# Patient Record
Sex: Male | Born: 1940 | Race: Black or African American | Hispanic: No | Marital: Married | Smoking: Never smoker
Health system: Southern US, Community
[De-identification: ages and names within clinical notes are randomized; demographics above are authoritative.]

## PROBLEM LIST (undated history)

## (undated) DIAGNOSIS — I519 Heart disease, unspecified: Secondary | ICD-10-CM

## (undated) DIAGNOSIS — M1990 Unspecified osteoarthritis, unspecified site: Secondary | ICD-10-CM

## (undated) DIAGNOSIS — I429 Cardiomyopathy, unspecified: Secondary | ICD-10-CM

## (undated) DIAGNOSIS — I10 Essential (primary) hypertension: Secondary | ICD-10-CM

## (undated) DIAGNOSIS — I219 Acute myocardial infarction, unspecified: Secondary | ICD-10-CM

## (undated) DIAGNOSIS — E785 Hyperlipidemia, unspecified: Secondary | ICD-10-CM

## (undated) DIAGNOSIS — K4090 Unilateral inguinal hernia, without obstruction or gangrene, not specified as recurrent: Secondary | ICD-10-CM

## (undated) HISTORY — DX: Heart disease, unspecified: I51.9

## (undated) HISTORY — DX: Unilateral inguinal hernia, without obstruction or gangrene, not specified as recurrent: K40.90

## (undated) HISTORY — DX: Unspecified osteoarthritis, unspecified site: M19.90

## (undated) HISTORY — PX: HERNIA REPAIR: SHX51

## (undated) HISTORY — PX: CORONARY ANGIOPLASTY WITH STENT PLACEMENT: SHX49

---

## 2005-11-04 ENCOUNTER — Other Ambulatory Visit: Payer: Self-pay

## 2005-11-04 ENCOUNTER — Emergency Department: Payer: Self-pay | Admitting: Emergency Medicine

## 2006-06-15 ENCOUNTER — Ambulatory Visit: Payer: Self-pay | Admitting: Gastroenterology

## 2006-11-16 HISTORY — PX: LIPOMA EXCISION: SHX5283

## 2007-02-09 ENCOUNTER — Ambulatory Visit: Payer: Self-pay | Admitting: *Deleted

## 2011-04-07 ENCOUNTER — Emergency Department: Payer: Self-pay | Admitting: Internal Medicine

## 2014-01-29 ENCOUNTER — Ambulatory Visit (INDEPENDENT_AMBULATORY_CARE_PROVIDER_SITE_OTHER): Payer: Medicare Other | Admitting: General Surgery

## 2014-01-29 ENCOUNTER — Encounter: Payer: Self-pay | Admitting: General Surgery

## 2014-01-29 VITALS — BP 110/70 | HR 74 | Resp 12 | Ht 71.0 in | Wt 146.0 lb

## 2014-01-29 DIAGNOSIS — K4090 Unilateral inguinal hernia, without obstruction or gangrene, not specified as recurrent: Secondary | ICD-10-CM

## 2014-01-29 DIAGNOSIS — K4020 Bilateral inguinal hernia, without obstruction or gangrene, not specified as recurrent: Secondary | ICD-10-CM

## 2014-01-29 DIAGNOSIS — K4021 Bilateral inguinal hernia, without obstruction or gangrene, recurrent: Secondary | ICD-10-CM

## 2014-01-29 NOTE — Patient Instructions (Addendum)
Patient to be schedule for bilateral inguinal hernia repairs. Patient to stop Plavix prior to   Hernia Repair with Laparoscope A hernia occurs when an internal organ pushes out through a weak spot in the belly (abdominal) wall muscles. Hernias most commonly occur in the groin and around the navel. Hernias can also occur through a cut by the surgeon (incision) after an abdominal operation. A hernia may be caused by:  Lifting heavy objects.  Prolonged coughing.  Straining to move your bowels. Hernias can often be pushed back into place (reduced). Most hernias tend to get worse over time. Problems occur when abdominal contents get stuck in the opening and the blood supply is blocked or impaired (incarcerated hernia). Because of these risks, you require surgery to repair the hernia. Your hernia will be repaired using a laparoscope. Laparoscopic surgery is a type of minimally invasive surgery. It does not involve making a typical surgical cut (incision) in the skin. A laparoscope is a telescope-like rod and lens system. It is usually connected to a video camera and a light source so your caregiver can clearly see the operative area. The instruments are inserted through  to  inch (5 mm or 10 mm) openings in the skin at specific locations. A working and viewing space is created by blowing a small amount of carbon dioxide gas into the abdominal cavity. The abdomen is essentially blown up like a balloon (insufflated). This elevates the abdominal wall above the internal organs like a dome. The carbon dioxide gas is common to the human body and can be absorbed by tissue and removed by the respiratory system. Once the repair is completed, the small incisions will be closed with either stitches (sutures) or staples (just like a paper stapler only this staple holds the skin together). LET YOUR CAREGIVERS KNOW ABOUT:  Allergies.  Medications taken including herbs, eye drops, over the counter medications, and  creams.  Use of steroids (by mouth or creams).  Previous problems with anesthetics or Novocaine.  Possibility of pregnancy, if this applies.  History of blood clots (thrombophlebitis).  History of bleeding or blood problems.  Previous surgery.  Other health problems. BEFORE THE PROCEDURE  Laparoscopy can be done either in a hospital or out-patient clinic. You may be given a mild sedative to help you relax before the procedure. Once in the operating room, you will be given a general anesthesia to make you sleep (unless you and your caregiver choose a different anesthetic).  AFTER THE PROCEDURE  After the procedure you will be watched in a recovery area. Depending on what type of hernia was repaired, you might be admitted to the hospital or you might go home the same day. With this procedure you may have less pain and scarring. This usually results in a quicker recovery and less risk of infection. HOME CARE INSTRUCTIONS   Bed rest is not required. You may continue your normal activities but avoid heavy lifting (more than 10 pounds) or straining.  Cough gently. If you are a smoker it is best to stop, as even the best hernia repair can break down with the continual strain of coughing.  Avoid driving until given the OK by your surgeon.  There are no dietary restrictions unless given otherwise.  TAKE ALL MEDICATIONS AS DIRECTED.  Only take over-the-counter or prescription medicines for pain, discomfort, or fever as directed by your caregiver. SEEK MEDICAL CARE IF:   There is increasing abdominal pain or pain in your incisions.  There is  more bleeding from incisions, other than minimal spotting.  You feel light headed or faint.  You develop an unexplained fever, chills, and/or an oral temperature above 102 F (38.9 C).  You have redness, swelling, or increasing pain in the wound.  Pus coming from wound.  A foul smell coming from the wound or dressings. SEEK IMMEDIATE MEDICAL  CARE IF:   You develop a rash.  You have difficulty breathing.  You have any allergic problems. MAKE SURE YOU:   Understand these instructions.  Will watch your condition.  Will get help right away if you are not doing well or get worse. Document Released: 11/02/2005 Document Revised: 01/25/2012 Document Reviewed: 10/02/2009 [facility] Patient Information 2014 [place], [place].  Patient's surgery has been scheduled for 02-15-14 at [facility]. This patient will see Dr. Saralyn Pilar at [facility] on 02-08-14 at 2 pm for cardiac clearance. He is aware of all instructions.

## 2014-01-29 NOTE — Progress Notes (Signed)
Patient ID: ADAR RASE, male   DOB: 10-20-1941, 73 y.o.   MRN: [id number]  Chief Complaint  Patient presents with  . Follow-up    Evaluation of recurrent inguinal hernia. Patient last seen in 2008    HPI Samuel Mcgee is a 73 y.o. male who presents for an evaluation of a recurrent left inguinal hernia. The patient states he had this hernia repaired approximately 12 years ago but is back now. He noticed the hernia again approximately 2 years ago. It has gotten larger and is tender. He is able to push the hernia back in.   HPI  Past Medical History  Diagnosis Date  . Heart disease   . Inguinal hernia   . Arthritis     Past Surgical History  Procedure Laterality Date  . Hernia repair Left 2003?  Marland Kitchen Lipoma excision Right 2008    right posterior neck    History reviewed. No pertinent family history.  Social History History  Substance Use Topics  . Smoking status: Never Smoker   . Smokeless tobacco: Never Used  . Alcohol Use: No    No Known Allergies  Current Outpatient Prescriptions  Medication Sig Dispense Refill  . aspirin 81 MG tablet Take 81 mg by mouth daily.      Marland Kitchen atorvastatin (LIPITOR) 80 MG tablet Take 80 mg by mouth daily.      . carvedilol (COREG) 6.25 MG tablet Take 6.25 mg by mouth 2 (two) times daily with a meal.      . clopidogrel (PLAVIX) 75 MG tablet Take 75 mg by mouth daily with breakfast.      . lisinopril (PRINIVIL,ZESTRIL) 10 MG tablet Take 10 mg by mouth daily.       No current facility-administered medications for this visit.    Review of Systems Review of Systems  Constitutional: Negative.   Respiratory: Negative.   Cardiovascular: Negative.   Gastrointestinal: Positive for abdominal pain.    Blood pressure 110/70, pulse 74, resp. rate 12, height 5\' 11"  (1.803 m), weight 146 lb (66.225 kg).  Physical Exam Physical Exam  Constitutional: He is oriented to person, place, and time. He appears well-developed and well-nourished.   Eyes: Conjunctivae are normal. No scleral icterus.  Neck: Neck supple. No thyromegaly present.  Cardiovascular: Normal rate, regular rhythm and normal heart sounds.   No murmur heard. Pulmonary/Chest: Effort normal and breath sounds normal.  Abdominal: Soft. Normal appearance and bowel sounds are normal. There is no hepatosplenomegaly. There is no tenderness. A hernia is present. Hernia confirmed positive in the right inguinal area (small hernia present) and confirmed positive in the left inguinal area (medium sized hernia present).  Lymphadenopathy:    He has no cervical adenopathy.  Neurological: He is alert and oriented to person, place, and time.  Skin: Skin is warm and dry.    Data Reviewed none  Assessment    Bilateral ing hernia, left recurrent     Plan    Discussed repair. Feel laparoscopy and bil hernia repair is reasonable. Risks/benfits dicussed. Pt is agreeable.     Patient's surgery has been scheduled for 02-15-14 at [facility]. This patient will see his cardiologist, Dr. Saralyn Pilar at [facility], on 02-08-14 at 2 pm for cardiac clearance. Patient will need to stop Plavix for 5 days prior to surgery if Dr. Saralyn Pilar is agreeable. He is aware of all instructions.    SANKAR,SEEPLAPUTHUR G 01/30/2014, 6:22 AM

## 2014-01-30 ENCOUNTER — Encounter: Payer: Self-pay | Admitting: General Surgery

## 2014-01-30 DIAGNOSIS — K4021 Bilateral inguinal hernia, without obstruction or gangrene, recurrent: Secondary | ICD-10-CM | POA: Insufficient documentation

## 2014-01-30 LAB — CBC WITH DIFFERENTIAL/PLATELET
BASOS ABS: 0 10*3/uL (ref 0.0–0.2)
BASOS: 1 %
Eos: 2 %
Eosinophils Absolute: 0.1 10*3/uL (ref 0.0–0.4)
HCT: 40 % (ref 37.5–51.0)
HEMOGLOBIN: 13.6 g/dL (ref 12.6–17.7)
Immature Grans (Abs): 0 10*3/uL (ref 0.0–0.1)
Immature Granulocytes: 0 %
LYMPHS ABS: 1.9 10*3/uL (ref 0.7–3.1)
LYMPHS: 41 %
MCH: 28.5 pg (ref 26.6–33.0)
MCHC: 34 g/dL (ref 31.5–35.7)
MCV: 84 fL (ref 79–97)
MONOS ABS: 0.5 10*3/uL (ref 0.1–0.9)
Monocytes: 11 %
NEUTROS ABS: 2.1 10*3/uL (ref 1.4–7.0)
Neutrophils Relative %: 45 %
RBC: 4.78 x10E6/uL (ref 4.14–5.80)
RDW: 14.6 % (ref 12.3–15.4)
WBC: 4.6 10*3/uL (ref 3.4–10.8)

## 2014-01-30 LAB — COMPREHENSIVE METABOLIC PANEL
ALK PHOS: 78 IU/L (ref 39–117)
ALT: 14 IU/L (ref 0–44)
AST: 16 IU/L (ref 0–40)
Albumin/Globulin Ratio: 1.5 (ref 1.1–2.5)
Albumin: 4.3 g/dL (ref 3.5–4.8)
BILIRUBIN TOTAL: 0.5 mg/dL (ref 0.0–1.2)
BUN / CREAT RATIO: 25 — AB (ref 10–22)
BUN: 32 mg/dL — ABNORMAL HIGH (ref 8–27)
CHLORIDE: 104 mmol/L (ref 97–108)
CO2: 23 mmol/L (ref 18–29)
Calcium: 9.7 mg/dL (ref 8.6–10.2)
Creatinine, Ser: 1.29 mg/dL — ABNORMAL HIGH (ref 0.76–1.27)
GFR, EST AFRICAN AMERICAN: 63 mL/min/{1.73_m2} (ref >59)
GFR, EST NON AFRICAN AMERICAN: 54 mL/min/{1.73_m2} — AB (ref >59)
Globulin, Total: 2.9 g/dL (ref 1.5–4.5)
Glucose: 114 mg/dL — ABNORMAL HIGH (ref 65–99)
POTASSIUM: 4.8 mmol/L (ref 3.5–5.2)
SODIUM: 142 mmol/L (ref 134–144)
Total Protein: 7.2 g/dL (ref 6.0–8.5)

## 2014-02-06 ENCOUNTER — Other Ambulatory Visit: Payer: Self-pay | Admitting: General Surgery

## 2014-02-06 DIAGNOSIS — K4021 Bilateral inguinal hernia, without obstruction or gangrene, recurrent: Secondary | ICD-10-CM

## 2014-02-08 ENCOUNTER — Telehealth: Payer: Self-pay | Admitting: *Deleted

## 2014-02-08 ENCOUNTER — Ambulatory Visit: Payer: Self-pay | Admitting: General Surgery

## 2014-02-08 DIAGNOSIS — I10 Essential (primary) hypertension: Secondary | ICD-10-CM

## 2014-02-08 LAB — CBC WITH DIFFERENTIAL/PLATELET
Basophil #: 0 10*3/uL (ref 0.0–0.1)
Basophil %: 0.6 %
EOS ABS: 0.1 10*3/uL (ref 0.0–0.7)
EOS PCT: 1.5 %
HCT: 37.9 % — ABNORMAL LOW (ref 40.0–52.0)
HGB: 12.1 g/dL — ABNORMAL LOW (ref 13.0–18.0)
LYMPHS ABS: 1.4 10*3/uL (ref 1.0–3.6)
Lymphocyte %: 26.9 %
MCH: 28.3 pg (ref 26.0–34.0)
MCHC: 32 g/dL (ref 32.0–36.0)
MCV: 89 fL (ref 80–100)
Monocyte #: 0.7 x10 3/mm (ref 0.2–1.0)
Monocyte %: 14.7 %
Neutrophil #: 2.8 10*3/uL (ref 1.4–6.5)
Neutrophil %: 56.3 %
Platelet: 140 10*3/uL — ABNORMAL LOW (ref 150–440)
RBC: 4.28 10*6/uL — AB (ref 4.40–5.90)
RDW: 14.2 % (ref 11.5–14.5)
WBC: 5 10*3/uL (ref 3.8–10.6)

## 2014-02-08 LAB — BASIC METABOLIC PANEL
Anion Gap: 3 — ABNORMAL LOW (ref 7–16)
BUN: 24 mg/dL — AB (ref 7–18)
CHLORIDE: 107 mmol/L (ref 98–107)
CO2: 30 mmol/L (ref 21–32)
Calcium, Total: 8.7 mg/dL (ref 8.5–10.1)
Creatinine: 1.34 mg/dL — ABNORMAL HIGH (ref 0.60–1.30)
GFR CALC NON AF AMER: 52 — AB
Glucose: 126 mg/dL — ABNORMAL HIGH (ref 65–99)
Osmolality: 285 (ref 275–301)
Potassium: 4.1 mmol/L (ref 3.5–5.1)
SODIUM: 140 mmol/L (ref 136–145)

## 2014-02-08 NOTE — Telephone Encounter (Signed)
Per Threasa Beards, pre-admission testing called today to report that patient missed his pre-admission appointment.   Wife was called and questioned about this. She states patient is at the hospital now. I called pre-admission and they state he is not there. Wife was asked to have patient call the office back once he returns home to clarify.

## 2014-02-08 NOTE — Telephone Encounter (Signed)
Patient called back to confirm that he did get appointment times with Dr. Saralyn Pilar and Pre-admit Testing mixed up. He states that he did go for both appointments though. Date for surgery was confirmed with the patient. This patient was instructed to call should he have further questions.  Santiago Glad from [employer] confirmed that patient was seen this afternoon for pre-admission testing appointment.

## 2014-02-09 ENCOUNTER — Encounter: Payer: Self-pay | Admitting: General Surgery

## 2014-02-15 ENCOUNTER — Telehealth: Payer: Self-pay | Admitting: General Surgery

## 2014-02-15 ENCOUNTER — Telehealth: Payer: Self-pay | Admitting: *Deleted

## 2014-02-15 NOTE — Telephone Encounter (Signed)
Per Chong Sicilian (nurse at [facility]), patient was seen today by Dr. Tomasita Morrow and placed on Augmentin x 10 days. The nurse is aware we need to have medical clearance faxed to the office before we can get patient's hernia surgery rescheduled.

## 2014-02-15 NOTE — Telephone Encounter (Signed)
PATIENT CALLED TODAY STATING HE WAS UNABLE TO HAVE IS SURGERY BY DR Jamal Collin TODAY(02-15-14). HE HAD A FEVER & WENT TO HIS PCP(DR EMMA WILLIAMS). HE WAS ASK TO CALL TO THEIR OFC & HAVE THEM FAX A NOTE STATING HE WAS CLEARED TOHAVE SURGERY.AT [place] CAN BE RESCHEDULE .

## 2014-03-06 ENCOUNTER — Telehealth: Payer: Self-pay | Admitting: *Deleted

## 2014-03-06 NOTE — Telephone Encounter (Signed)
Pt called and wants to reschedule his hernia surgery with Dr.Sankar, there was a note in his chart on 02/15/14 stating that they are waiting to get his medical clearance back before we can reschedule.

## 2014-03-07 NOTE — Telephone Encounter (Signed)
Spoke with patient's wife and let her know that the patient can call back to reschedule his hernia surgery. I also let her know that he will need a pre op appointment with Dr Jamal Collin before he has his hernia surgery.

## 2014-03-21 ENCOUNTER — Encounter: Payer: Self-pay | Admitting: General Surgery

## 2014-03-21 ENCOUNTER — Ambulatory Visit (INDEPENDENT_AMBULATORY_CARE_PROVIDER_SITE_OTHER): Payer: Medicare Other | Admitting: General Surgery

## 2014-03-21 VITALS — BP 122/74 | HR 66 | Temp 97.3°F | Resp 14 | Ht 71.0 in | Wt 139.0 lb

## 2014-03-21 DIAGNOSIS — K4021 Bilateral inguinal hernia, without obstruction or gangrene, recurrent: Secondary | ICD-10-CM

## 2014-03-21 NOTE — Progress Notes (Signed)
Patient ID: Samuel Mcgee, male   DOB: Jan 29, 1941, 73 y.o.   MRN: [id number]  Chief Complaint  Patient presents with  . Pre-op Exam    bilateral hernia    HPI Samuel Mcgee is a 73 y.o. male.  Here today for pre operative visit for bilateral inguinal hernia. States he has been doing well. He had previously been scheduled for surgery 02-15-14 but presented with fever on the day of surgery. He has since then recovered from sinusitis and cleared for surgery.  HPI  Past Medical History  Diagnosis Date  . Heart disease   . Inguinal hernia   . Arthritis     Past Surgical History  Procedure Laterality Date  . Hernia repair Left 2003?  Marland Kitchen Lipoma excision Right 2008    right posterior neck    History reviewed. No pertinent family history.  Social History History  Substance Use Topics  . Smoking status: Never Smoker   . Smokeless tobacco: Never Used  . Alcohol Use: No    No Known Allergies  Current Outpatient Prescriptions  Medication Sig Dispense Refill  . aspirin 81 MG tablet Take 81 mg by mouth daily.      Marland Kitchen atorvastatin (LIPITOR) 80 MG tablet Take 80 mg by mouth daily.      . carvedilol (COREG) 6.25 MG tablet Take 6.25 mg by mouth 2 (two) times daily with a meal.      . clopidogrel (PLAVIX) 75 MG tablet Take 75 mg by mouth daily with breakfast.      . lisinopril (PRINIVIL,ZESTRIL) 10 MG tablet Take 10 mg by mouth daily.       No current facility-administered medications for this visit.    Review of Systems Review of Systems  Constitutional: Negative.   Respiratory: Negative.   Cardiovascular: Negative.     Blood pressure 122/74, pulse 66, temperature 97.3 F (36.3 C), temperature source Oral, resp. rate 14, height 5\' 11"  (1.803 m), weight 139 lb (63.05 kg).  Physical Exam Physical Exam  Constitutional: He is oriented to person, place, and time. He appears well-developed and well-nourished.  Eyes: Conjunctivae are normal.  Neck: Neck supple.   Cardiovascular: Normal rate, regular rhythm and normal heart sounds.   Pulmonary/Chest: Effort normal and breath sounds normal.  Abdominal: Soft. Normal appearance. There is no tenderness. A hernia is present. Hernia confirmed positive in the right inguinal area and confirmed positive in the left inguinal area.  Lymphadenopathy:    He has no cervical adenopathy.  Neurological: He is alert and oriented to person, place, and time.  Skin: Skin is warm and dry.    Data Reviewed Clearance from Dr Jimmye Norman PCP  Assessment    Bilateral inguinal hernia present.    Plan    Reschedule surgery.    Patient's surgery has been rescheduled at [facility] for 04/03/14. He will pre admit by phone on 03/23/14. He will hold his Plavix for 5 days prior and he will hold his Aspirin for 3 days prior to surgery. Patient is aware of date, and all instructions.    Samuel Mcgee 03/21/2014, 6:46 PM

## 2014-03-21 NOTE — Patient Instructions (Addendum)
Patient's surgery has been rescheduled at [facility] for 04/03/14. He will pre admit by phone on 03/23/14. He will hold his Plavix for 5 days prior and he will hold his Aspirin for 3 days prior to surgery. Patient is aware of date, and all instructions.

## 2014-04-03 ENCOUNTER — Ambulatory Visit: Payer: Self-pay | Admitting: General Surgery

## 2014-04-03 DIAGNOSIS — K4020 Bilateral inguinal hernia, without obstruction or gangrene, not specified as recurrent: Secondary | ICD-10-CM

## 2014-04-03 HISTORY — PX: INGUINAL HERNIA REPAIR: SHX194

## 2014-04-03 HISTORY — PX: HERNIA REPAIR: SHX51

## 2014-04-04 ENCOUNTER — Encounter: Payer: Self-pay | Admitting: General Surgery

## 2014-04-12 ENCOUNTER — Encounter: Payer: Self-pay | Admitting: General Surgery

## 2014-04-12 ENCOUNTER — Ambulatory Visit (INDEPENDENT_AMBULATORY_CARE_PROVIDER_SITE_OTHER): Payer: Medicare Other | Admitting: General Surgery

## 2014-04-12 VITALS — BP 104/66 | HR 60 | Resp 12 | Ht 71.0 in | Wt 136.0 lb

## 2014-04-12 DIAGNOSIS — K4021 Bilateral inguinal hernia, without obstruction or gangrene, recurrent: Secondary | ICD-10-CM

## 2014-04-12 NOTE — Progress Notes (Signed)
Here today for postoperative bilateral inguinal hernia done 04-03-14. States he is doing well. Minimal pain.  Abdomin soft. Port sites healing well.  Patient does have a 4-5 cm seroma in the left groin. Likely from the large hernia he had there. No hernias identified at this time.   Seroma was aspirated  And removed  35 ml. Blood stained seroma fluid. Patient to return in 2 weeks.

## 2014-04-12 NOTE — Patient Instructions (Addendum)
The patient is aware to call back for any questions or concerns. Patient advised to use a heating pad daily. Patient to return in 2-3 weeks.

## 2014-05-02 ENCOUNTER — Ambulatory Visit: Payer: Medicare Other | Admitting: General Surgery

## 2014-05-02 ENCOUNTER — Encounter: Payer: Self-pay | Admitting: General Surgery

## 2014-05-02 VITALS — BP 112/70 | HR 60 | Resp 12 | Ht 71.0 in | Wt 133.0 lb

## 2014-05-02 DIAGNOSIS — T148XXA Other injury of unspecified body region, initial encounter: Secondary | ICD-10-CM

## 2014-05-02 DIAGNOSIS — K4021 Bilateral inguinal hernia, without obstruction or gangrene, recurrent: Secondary | ICD-10-CM

## 2014-05-02 NOTE — Patient Instructions (Signed)
The patient is aware to call back for any questions or concerns.  

## 2014-05-02 NOTE — Progress Notes (Signed)
Here today for postoperative visit. He had bilateral inguinal hernia repair 04-03-14. States he is doing well. Not using any pain mediation. Slight knot still on the left groin area.  Seroma left groin, old hematoma too thick to drain with syringe. Hernia repairs are intact. Will recheck in 2 weeks.Advised to use heat to the area of swelling.

## 2014-05-16 ENCOUNTER — Ambulatory Visit: Payer: Medicare Other | Admitting: General Surgery

## 2014-05-22 ENCOUNTER — Encounter: Payer: Self-pay | Admitting: General Surgery

## 2014-05-22 ENCOUNTER — Ambulatory Visit (INDEPENDENT_AMBULATORY_CARE_PROVIDER_SITE_OTHER): Payer: Self-pay | Admitting: General Surgery

## 2014-05-22 VITALS — BP 108/62 | HR 70 | Resp 12 | Ht 71.0 in | Wt 135.0 lb

## 2014-05-22 DIAGNOSIS — K4021 Bilateral inguinal hernia, without obstruction or gangrene, recurrent: Secondary | ICD-10-CM

## 2014-05-22 NOTE — Progress Notes (Signed)
Here today for postoperative visit. He had bilateral inguinal hernia repair 04-03-14. States he is doing well. Not using any pain mediation Hernia repairs are intact. Hematoma is much smaller in left groin.  Patient to return in one month

## 2014-05-22 NOTE — Patient Instructions (Addendum)
Patient to return in one month. 

## 2014-05-23 ENCOUNTER — Encounter: Payer: Self-pay | Admitting: General Surgery

## 2014-06-21 ENCOUNTER — Ambulatory Visit (INDEPENDENT_AMBULATORY_CARE_PROVIDER_SITE_OTHER): Payer: Self-pay | Admitting: General Surgery

## 2014-06-21 ENCOUNTER — Encounter: Payer: Self-pay | Admitting: General Surgery

## 2014-06-21 VITALS — BP 110/70 | HR 66 | Resp 12 | Ht 71.0 in | Wt 134.0 lb

## 2014-06-21 DIAGNOSIS — K4021 Bilateral inguinal hernia, without obstruction or gangrene, recurrent: Secondary | ICD-10-CM

## 2014-06-21 NOTE — Progress Notes (Signed)
Here today for postoperative visit, bilateral inguinal hernia repair done 04-03-14. States he is doing well.  Hernia repairs are intact. Hematoma is much smaller, markedly reduced in size (1.5cm) in left groin. Follow as needed.

## 2014-06-21 NOTE — Patient Instructions (Signed)
Follow up as needed

## 2015-03-09 NOTE — Op Note (Signed)
PATIENT NAME:  Samuel Mcgee, Samuel Mcgee MR#:  [id number] DATE OF BIRTH:  11-21-1940  DATE OF PROCEDURE:  04/03/2014  PREOPERATIVE DIAGNOSIS: Bilateral inguinal hernias.   POSTOPERATIVE DIAGNOSIS: Bilateral inguinal hernias, direct variety.   OPERATION PERFORMED: Laparoscopy, repair of bilateral inguinal hernias.   SURGEON: S.G. Jamal Collin, MD  ANESTHESIA: General.   COMPLICATIONS: None.   ESTIMATED BLOOD LOSS: Minimal.   DRAINS: None.   DESCRIPTION OF PROCEDURE: The patient was put to sleep in the supine position on the operating table. A Foley catheter was inserted and removed at the end of the procedure. The abdomen was prepped and draped out as a sterile field. Timeout was performed. A small incision was made just above the umbilicus, and the Veress needle with the InnerDyne sleeve was positioned in the peritoneal cavity, verified with the hanging drop method. Pneumoperitoneum was obtained, followed by placement of a 10 mm port. The camera was then introduced, and 2 lateral 5 mm ports were placed, a 5 mm on the left and an 11 mm on the right. With the patient placed in slight Trendelenburg, the inguinal regions were exposed, showing the patient had fairly sizable defect in the medial portion of the inguinal canal just medial to the cord structures on the left side, and in the more medial location, located just above and lateral to the pubic symphysis on the right side. No indirect sac was noted. With careful exposure, the peritoneum was scored using a Thunderbeat device across the inguinal canal on the left side, and the peritoneum was reflected off from the posterior aspect of the inguinal canal. On the left side, there was a large fatty protrusion of preperitoneal fat going through the defect in the medial portion. This was freed completely, and with the Thunderbeat device, was taken down and then removed from the peritoneal cavity with a retrieval bag. The internal ring area was adequately exposed. The  posterior wall was well-delineated. In a similar fashion, the peritoneum was scored on the right side, and again the peritoneal reflection was taken down to expose the defect in the medial portion of the inguinal canal, with the posterior wall exposed all the way past the internal ring area. Following this, two Bard 3DMax mesh were positioned in the peritoneal cavity. This was the medium size, one for the left and one for the right. These were then positioned into the posterior wall area with the medial marked edge placed against the pubic tubercle. SecureStrap was used to tack it to the pubic tubercle and the inguinal ligament below and just lateral to the internal ring, and a couple placed along the superior aspect and medial aspect. This was done on both sides. A satisfactory reinforcement with the mesh was noted. The peritoneum was then reapproximated over the mesh using again the SecureStrap to tack it down. Following this, the pneumoperitoneum was released. The fascial opening in the umbilicus was closed with 0 Vicryl. Ports were removed, and the skin incision was closed with subcuticular 4-0 Vicryl, covered with Dermabond. The procedure was well tolerated, and he was subsequently extubated and returned to the recovery room in stable condition.   ____________________________ S.Robinette Haines, MD sgs:lb D: 04/04/2014 08:13:35 ET T: 04/04/2014 08:26:51 ET JOB#: [id number]  cc: S.G. Jamal Collin, MD, <Dictator> [facility] Robinette Haines MD ELECTRONICALLY SIGNED 04/05/2014 14:44

## 2016-12-14 ENCOUNTER — Encounter: Payer: Self-pay | Admitting: *Deleted

## 2016-12-15 ENCOUNTER — Ambulatory Visit
Admission: RE | Admit: 2016-12-15 | Discharge: 2016-12-15 | Disposition: A | Discharge status: Home / Self Care | Payer: Medicare HMO | Source: Ambulatory Visit | Attending: Gastroenterology | Admitting: Gastroenterology

## 2016-12-15 ENCOUNTER — Ambulatory Visit: Payer: Medicare HMO | Admitting: Anesthesiology

## 2016-12-15 ENCOUNTER — Encounter
Admission: RE | Disposition: A | Discharge status: Home / Self Care | Payer: Self-pay | Source: Ambulatory Visit | Attending: Gastroenterology

## 2016-12-15 DIAGNOSIS — D120 Benign neoplasm of cecum: Secondary | ICD-10-CM | POA: Insufficient documentation

## 2016-12-15 DIAGNOSIS — Z7902 Long term (current) use of antithrombotics/antiplatelets: Secondary | ICD-10-CM | POA: Insufficient documentation

## 2016-12-15 DIAGNOSIS — I429 Cardiomyopathy, unspecified: Secondary | ICD-10-CM | POA: Diagnosis not present

## 2016-12-15 DIAGNOSIS — K648 Other hemorrhoids: Secondary | ICD-10-CM | POA: Diagnosis not present

## 2016-12-15 DIAGNOSIS — Z7982 Long term (current) use of aspirin: Secondary | ICD-10-CM | POA: Diagnosis not present

## 2016-12-15 DIAGNOSIS — I252 Old myocardial infarction: Secondary | ICD-10-CM | POA: Diagnosis not present

## 2016-12-15 DIAGNOSIS — Z1211 Encounter for screening for malignant neoplasm of colon: Secondary | ICD-10-CM | POA: Insufficient documentation

## 2016-12-15 DIAGNOSIS — I2510 Atherosclerotic heart disease of native coronary artery without angina pectoris: Secondary | ICD-10-CM | POA: Diagnosis not present

## 2016-12-15 DIAGNOSIS — Z79899 Other long term (current) drug therapy: Secondary | ICD-10-CM | POA: Diagnosis not present

## 2016-12-15 DIAGNOSIS — N400 Enlarged prostate without lower urinary tract symptoms: Secondary | ICD-10-CM | POA: Diagnosis not present

## 2016-12-15 DIAGNOSIS — M1990 Unspecified osteoarthritis, unspecified site: Secondary | ICD-10-CM | POA: Insufficient documentation

## 2016-12-15 DIAGNOSIS — Z955 Presence of coronary angioplasty implant and graft: Secondary | ICD-10-CM | POA: Diagnosis not present

## 2016-12-15 DIAGNOSIS — E785 Hyperlipidemia, unspecified: Secondary | ICD-10-CM | POA: Diagnosis not present

## 2016-12-15 DIAGNOSIS — I10 Essential (primary) hypertension: Secondary | ICD-10-CM | POA: Diagnosis not present

## 2016-12-15 HISTORY — PX: COLONOSCOPY WITH PROPOFOL: SHX5780

## 2016-12-15 HISTORY — DX: Essential (primary) hypertension: I10

## 2016-12-15 HISTORY — DX: Acute myocardial infarction, unspecified: I21.9

## 2016-12-15 HISTORY — DX: Cardiomyopathy, unspecified: I42.9

## 2016-12-15 HISTORY — DX: Hyperlipidemia, unspecified: E78.5

## 2016-12-15 SURGERY — COLONOSCOPY WITH PROPOFOL
Anesthesia: General

## 2016-12-15 MED ORDER — PROPOFOL 500 MG/50ML IV EMUL
INTRAVENOUS | Status: DC | PRN
  Administered 2016-12-15: 25 ug/kg/min via INTRAVENOUS

## 2016-12-15 MED ORDER — FENTANYL CITRATE (PF) 100 MCG/2ML IJ SOLN
INTRAMUSCULAR | Status: AC
  Filled 2016-12-15: qty 2

## 2016-12-15 MED ORDER — EPHEDRINE 5 MG/ML INJ
INTRAVENOUS | Status: AC
  Filled 2016-12-15: qty 10

## 2016-12-15 MED ORDER — PROPOFOL 500 MG/50ML IV EMUL
INTRAVENOUS | Status: AC
  Filled 2016-12-15: qty 50

## 2016-12-15 MED ORDER — MIDAZOLAM HCL 2 MG/2ML IJ SOLN
INTRAMUSCULAR | Status: AC
  Filled 2016-12-15: qty 2

## 2016-12-15 MED ORDER — SODIUM CHLORIDE 0.9 % IV SOLN
INTRAVENOUS | Status: DC
  Administered 2016-12-15: 08:00:00 via INTRAVENOUS

## 2016-12-15 MED ORDER — SODIUM CHLORIDE 0.9 % IV SOLN: INTRAVENOUS | Status: DC

## 2016-12-15 MED ORDER — EPHEDRINE SULFATE 50 MG/ML IJ SOLN
INTRAMUSCULAR | Status: DC | PRN
  Administered 2016-12-15 (×5): 10 mg via INTRAVENOUS

## 2016-12-15 MED ORDER — FENTANYL CITRATE (PF) 100 MCG/2ML IJ SOLN
INTRAMUSCULAR | Status: DC | PRN
  Administered 2016-12-15: 25 ug via INTRAVENOUS

## 2016-12-15 MED ORDER — PROPOFOL 10 MG/ML IV BOLUS
INTRAVENOUS | Status: DC | PRN
  Administered 2016-12-15: 30 mg via INTRAVENOUS
  Administered 2016-12-15: 20 mg via INTRAVENOUS

## 2016-12-15 MED ORDER — LIDOCAINE HCL (PF) 2 % IJ SOLN
INTRAMUSCULAR | Status: DC | PRN
  Administered 2016-12-15: 50 mg

## 2016-12-15 NOTE — Anesthesia Post-op Follow-up Note (Cosign Needed)
Anesthesia QCDR form completed.        

## 2016-12-15 NOTE — Transfer of Care (Signed)
Immediate Anesthesia Transfer of Care Note  Patient: Samuel Mcgee  Procedure(s) Performed: Procedure(s): COLONOSCOPY WITH PROPOFOL (N/A)  Patient Location: PACU  Anesthesia Type:General  Level of Consciousness: sedated  Airway & Oxygen Therapy: Patient Spontanous Breathing and Patient connected to nasal cannula oxygen  Post-op Assessment: Report given to RN and Post -op Vital signs reviewed and stable  Post vital signs: Reviewed and stable  Last Vitals:  Vitals:   12/15/16 0735  BP: 137/88  Pulse: 100  Resp: 20  Temp: (!) 35.6 C    Last Pain:  Vitals:   12/15/16 0735  TempSrc: Tympanic         Complications: No apparent anesthesia complications

## 2016-12-15 NOTE — H&P (Signed)
Outpatient short stay form Pre-procedure 12/15/2016 8:08 AM Samuel Sails MD  Primary Physician: Dr Beverlyn Roux  Reason for visit:  Screening colonoscopy  History of present illness:  Patient is a 76 year old male presenting today for colon cancer screening. His last colonoscopy was about 10 years ago negative at that time. He does take Plavix but is held that for 6 days. He also takes a daily 81 mg aspirin last time yesterday. He takes no other blood thinning agents or aspirin products. He tolerated his prep well.    Current Facility-Administered Medications:  .  0.9 %  sodium chloride infusion, , Intravenous, Continuous, Samuel Sails, MD, Last Rate: 20 mL/hr at 12/15/16 0753 .  0.9 %  sodium chloride infusion, , Intravenous, Continuous, Samuel Sails, MD  Prescriptions Prior to Admission  Medication Sig Dispense Refill Last Dose  . aspirin 81 MG tablet Take 81 mg by mouth daily.   12/14/2016 at Unknown time  . carvedilol (COREG) 6.25 MG tablet Take 6.25 mg by mouth 2 (two) times daily with a meal.   12/14/2016 at Unknown time  . latanoprost (XALATAN) 0.005 % ophthalmic solution 1 drop at bedtime.   12/14/2016 at Unknown time  . predniSONE (DELTASONE) 10 MG tablet Take 10 mg by mouth daily with breakfast.   12/14/2016 at Unknown time  . triamcinolone (KENALOG) 0.025 % cream Apply 1 application topically 2 (two) times daily.   12/12/2016  . atorvastatin (LIPITOR) 80 MG tablet Take 80 mg by mouth daily.   12/09/2016  . clopidogrel (PLAVIX) 75 MG tablet Take 75 mg by mouth daily with breakfast.   12/09/2016  . lisinopril (PRINIVIL,ZESTRIL) 10 MG tablet Take 10 mg by mouth daily.   Taking     Allergies  Allergen Reactions  . Lisinopril      Past Medical History:  Diagnosis Date  . Arthritis   . Cardiomyopathy ([place])   . Heart disease   . Hyperlipidemia   . Hypertension   . Inguinal hernia   . Myocardial infarction     Review of systems:      Physical Exam    Heart  and lungs: Regular rate and rhythm without rub or gallop, lungs are bilaterally clear.    HEENT: Normocephalic atraumatic eyes are anicteric    Other:     Pertinant exam for procedure: Soft nontender nondistended bowel sounds positive normoactive   Planned proceedures: Colonoscopy and indicated procedures. I have discussed the risks benefits and complications of procedures to include not limited to bleeding, infection, perforation and the risk of sedation and the patient wishes to proceed.    Samuel Sails, MD Gastroenterology 12/15/2016  8:08 AM

## 2016-12-15 NOTE — Anesthesia Postprocedure Evaluation (Signed)
Anesthesia Post Note  Patient: Samuel Mcgee  Procedure(s) Performed: Procedure(s) (LRB): COLONOSCOPY WITH PROPOFOL (N/A)  Patient location during evaluation: Endoscopy Anesthesia Type: General Level of consciousness: awake and alert Pain management: pain level controlled Vital Signs Assessment: post-procedure vital signs reviewed and stable Respiratory status: spontaneous breathing, nonlabored ventilation, respiratory function stable and patient connected to nasal cannula oxygen Cardiovascular status: blood pressure returned to baseline and stable Postop Assessment: no signs of nausea or vomiting Anesthetic complications: no     Last Vitals:  Vitals:   12/15/16 0915 12/15/16 0925  BP: 119/83 (!) 125/93  Pulse: 68 61  Resp: 20 18  Temp:      Last Pain:  Vitals:   12/15/16 0925  TempSrc:   PainSc: 0-No pain                 Martha Clan

## 2016-12-15 NOTE — Anesthesia Preprocedure Evaluation (Signed)
Anesthesia Evaluation  Patient identified by MRN, date of birth, ID band Patient awake    Reviewed: Allergy & Precautions, H&P , NPO status , Patient's Chart, lab work & pertinent test results, reviewed documented beta blocker date and time   History of Anesthesia Complications Negative for: history of anesthetic complications  Airway Mallampati: I  TM Distance: >3 FB Neck ROM: full    Dental  (+) Missing, Poor Dentition   Pulmonary neg pulmonary ROS,           Cardiovascular Exercise Tolerance: Good hypertension, (-) angina+ CAD, + Past MI and + Cardiac Stents  (-) CABG (-) dysrhythmias (-) Valvular Problems/Murmurs     Neuro/Psych negative neurological ROS  negative psych ROS   GI/Hepatic negative GI ROS, Neg liver ROS,   Endo/Other  negative endocrine ROS  Renal/GU negative Renal ROS  negative genitourinary   Musculoskeletal   Abdominal   Peds  Hematology negative hematology ROS (+)   Anesthesia Other Findings Past Medical History: No date: Arthritis No date: Cardiomyopathy ([place]) No date: Heart disease No date: Hyperlipidemia No date: Hypertension No date: Inguinal hernia No date: Myocardial infarction   Reproductive/Obstetrics negative OB ROS                             Anesthesia Physical Anesthesia Plan  ASA: II  Anesthesia Plan: General   Post-op Pain Management:    Induction:   Airway Management Planned:   Additional Equipment:   Intra-op Plan:   Post-operative Plan:   Informed Consent: I have reviewed the patients History and Physical, chart, labs and discussed the procedure including the risks, benefits and alternatives for the proposed anesthesia with the patient or authorized representative who has indicated his/her understanding and acceptance.   Dental Advisory Given  Plan Discussed with: Anesthesiologist, CRNA and Surgeon  Anesthesia Plan Comments:          Anesthesia Quick Evaluation

## 2016-12-15 NOTE — Op Note (Signed)
[facility] Gastroenterology Patient Name: Samuel Mcgee Procedure Date: 12/15/2016 8:15 AM MRN: [id number] Account #: [id number] Date of Birth: [birth year] Admit Type: Outpatient Age: 76 Room: [facility] ENDO ROOM 1 Gender: Male Note Status: Finalized Procedure:            Colonoscopy Indications:          Screening for colorectal malignant neoplasm Providers:            Lollie Sails, MD Referring MD:         Myrle Sheng. Jimmye Norman, MD (Referring MD) Medicines:            Monitored Anesthesia Care Complications:        No immediate complications. Procedure:            Pre-Anesthesia Assessment:                       - ASA Grade Assessment: II - A patient with mild                        systemic disease.                       After obtaining informed consent, the colonoscope was                        passed under direct vision. Throughout the procedure,                        the patient's blood pressure, pulse, and oxygen                        saturations were monitored continuously. The                        Colonoscope was introduced through the anus and                        advanced to the the cecum, identified by appendiceal                        orifice and ileocecal valve. The colonoscopy was                        performed with moderate difficulty due to a tortuous                        colon. Successful completion of the procedure was aided                        by using manual pressure. The patient tolerated the                        procedure well. The quality of the bowel preparation                        was good. Findings:      A 2 mm polyp was found in the cecum. The polyp was flat. The polyp was       removed with a cold biopsy forceps. Resection and retrieval were       complete.  Non-bleeding internal hemorrhoids were found during retroflexion. The       hemorrhoids were small.      The digital rectal exam findings include enlarged  prostate.      The exam was otherwise without abnormality. Impression:           - One 2 mm polyp in the cecum, removed with a cold                        biopsy forceps. Resected and retrieved.                       - Non-bleeding internal hemorrhoids.                       - Enlarged prostate found on digital rectal exam.                       - The examination was otherwise normal. Recommendation:       - Discharge patient to home.                       - Soft diet today, then advance as tolerated to advance                        diet as tolerated. Procedure Code(s):    --- Professional ---                       [phone], Colonoscopy, flexible; with biopsy, single or                        multiple Diagnosis Code(s):    --- Professional ---                       Z12.11, Encounter for screening for malignant neoplasm                        of colon                       D12.0, Benign neoplasm of cecum                       K64.8, Other hemorrhoids                       N40.0, Benign prostatic hyperplasia without lower                        urinary tract symptoms CPT copyright 2016 American Medical Association. All rights reserved. The codes documented in this report are preliminary and upon coder review may  be revised to meet current compliance requirements. Lollie Sails, MD 12/15/2016 8:54:05 AM This report has been signed electronically. Number of Addenda: 0 Note Initiated On: 12/15/2016 8:15 AM Scope Withdrawal Time: 0 hours 8 minutes 58 seconds  Total Procedure Duration: 0 hours 23 minutes 15 seconds       [facility]

## 2016-12-16 ENCOUNTER — Encounter: Payer: Self-pay | Admitting: Gastroenterology

## 2016-12-16 LAB — SURGICAL PATHOLOGY

## 2019-07-01 ENCOUNTER — Other Ambulatory Visit: Payer: Self-pay

## 2019-07-01 DIAGNOSIS — Z20822 Contact with and (suspected) exposure to covid-19: Secondary | ICD-10-CM

## 2019-07-02 LAB — NOVEL CORONAVIRUS, NAA: SARS-CoV-2, NAA: NOT DETECTED

## 2019-07-13 ENCOUNTER — Telehealth: Payer: Self-pay | Admitting: Family Medicine

## 2019-07-13 NOTE — Telephone Encounter (Signed)
Patient's spouse called in for the results of the patient's Covid-19 test.  She was told that Covid was Not Detected.

## 2019-09-05 ENCOUNTER — Other Ambulatory Visit: Payer: Self-pay

## 2019-09-06 ENCOUNTER — Other Ambulatory Visit: Payer: Self-pay

## 2019-09-06 ENCOUNTER — Inpatient Hospital Stay: Payer: Medicare HMO | Attending: Oncology | Admitting: Oncology

## 2019-09-06 ENCOUNTER — Encounter: Payer: Self-pay | Admitting: Oncology

## 2019-09-06 ENCOUNTER — Encounter (INDEPENDENT_AMBULATORY_CARE_PROVIDER_SITE_OTHER): Payer: Self-pay

## 2019-09-06 ENCOUNTER — Inpatient Hospital Stay: Payer: Medicare HMO

## 2019-09-06 VITALS — BP 114/71 | HR 53 | Temp 96.5°F | Resp 16 | Wt 130.2 lb

## 2019-09-06 DIAGNOSIS — D649 Anemia, unspecified: Secondary | ICD-10-CM | POA: Insufficient documentation

## 2019-09-06 DIAGNOSIS — D696 Thrombocytopenia, unspecified: Secondary | ICD-10-CM | POA: Diagnosis not present

## 2019-09-06 DIAGNOSIS — Z21 Asymptomatic human immunodeficiency virus [HIV] infection status: Secondary | ICD-10-CM | POA: Insufficient documentation

## 2019-09-06 DIAGNOSIS — D72819 Decreased white blood cell count, unspecified: Secondary | ICD-10-CM | POA: Diagnosis present

## 2019-09-06 DIAGNOSIS — N400 Enlarged prostate without lower urinary tract symptoms: Secondary | ICD-10-CM | POA: Insufficient documentation

## 2019-09-06 DIAGNOSIS — D61818 Other pancytopenia: Secondary | ICD-10-CM | POA: Diagnosis not present

## 2019-09-06 DIAGNOSIS — R5382 Chronic fatigue, unspecified: Secondary | ICD-10-CM | POA: Diagnosis not present

## 2019-09-06 DIAGNOSIS — Z9884 Bariatric surgery status: Secondary | ICD-10-CM | POA: Insufficient documentation

## 2019-09-06 DIAGNOSIS — K648 Other hemorrhoids: Secondary | ICD-10-CM | POA: Insufficient documentation

## 2019-09-06 DIAGNOSIS — Z888 Allergy status to other drugs, medicaments and biological substances status: Secondary | ICD-10-CM

## 2019-09-06 DIAGNOSIS — Z79899 Other long term (current) drug therapy: Secondary | ICD-10-CM | POA: Diagnosis not present

## 2019-09-06 LAB — RETIC PANEL
Immature Retic Fract: 7.4 % (ref 2.3–15.9)
RBC.: 4.11 MIL/uL — ABNORMAL LOW (ref 4.22–5.81)
Retic Count, Absolute: 40.3 10*3/uL (ref 19.0–186.0)
Retic Ct Pct: 1 % (ref 0.4–3.1)
Reticulocyte Hemoglobin: 28.4 pg (ref >27.9)

## 2019-09-06 LAB — TECHNOLOGIST SMEAR REVIEW: Plt Morphology: DECREASED

## 2019-09-06 LAB — CBC WITH DIFFERENTIAL/PLATELET
Abs Immature Granulocytes: 0.01 10*3/uL (ref 0.00–0.07)
Basophils Absolute: 0 10*3/uL (ref 0.0–0.1)
Basophils Relative: 1 %
Eosinophils Absolute: 0.1 10*3/uL (ref 0.0–0.5)
Eosinophils Relative: 3 %
HCT: 37 % — ABNORMAL LOW (ref 39.0–52.0)
Hemoglobin: 11.8 g/dL — ABNORMAL LOW (ref 13.0–17.0)
Immature Granulocytes: 0 %
Lymphocytes Relative: 29 %
Lymphs Abs: 1 10*3/uL (ref 0.7–4.0)
MCH: 28.7 pg (ref 26.0–34.0)
MCHC: 31.9 g/dL (ref 30.0–36.0)
MCV: 90 fL (ref 80.0–100.0)
Monocytes Absolute: 0.4 10*3/uL (ref 0.1–1.0)
Monocytes Relative: 12 %
Neutro Abs: 2 10*3/uL (ref 1.7–7.7)
Neutrophils Relative %: 55 %
Platelets: 135 10*3/uL — ABNORMAL LOW (ref 150–400)
RBC: 4.11 MIL/uL — ABNORMAL LOW (ref 4.22–5.81)
RDW: 14 % (ref 11.5–15.5)
WBC: 3.6 10*3/uL — ABNORMAL LOW (ref 4.0–10.5)
nRBC: 0 % (ref 0.0–0.2)

## 2019-09-06 LAB — TSH: TSH: 1.518 u[IU]/mL (ref 0.350–4.500)

## 2019-09-06 LAB — IRON AND TIBC
Iron: 49 ug/dL (ref 45–182)
Saturation Ratios: 19 % (ref 17.9–39.5)
TIBC: 265 ug/dL (ref 250–450)
UIBC: 216 ug/dL

## 2019-09-06 LAB — HEPATITIS PANEL, ACUTE
HCV Ab: NONREACTIVE
Hep A IgM: NONREACTIVE
Hep B C IgM: NONREACTIVE
Hepatitis B Surface Ag: NONREACTIVE

## 2019-09-06 LAB — COMPREHENSIVE METABOLIC PANEL
ALT: 25 U/L (ref 0–44)
AST: 28 U/L (ref 15–41)
Albumin: 3.5 g/dL (ref 3.5–5.0)
Alkaline Phosphatase: 60 U/L (ref 38–126)
Anion gap: 7 (ref 5–15)
BUN: 25 mg/dL — ABNORMAL HIGH (ref 8–23)
CO2: 27 mmol/L (ref 22–32)
Calcium: 9 mg/dL (ref 8.9–10.3)
Chloride: 107 mmol/L (ref 98–111)
Creatinine, Ser: 1 mg/dL (ref 0.61–1.24)
GFR calc Af Amer: >60 mL/min (ref >60)
GFR calc non Af Amer: >60 mL/min (ref >60)
Glucose, Bld: 119 mg/dL — ABNORMAL HIGH (ref 70–99)
Potassium: 4.6 mmol/L (ref 3.5–5.1)
Sodium: 141 mmol/L (ref 135–145)
Total Bilirubin: 0.7 mg/dL (ref 0.3–1.2)
Total Protein: 7.3 g/dL (ref 6.5–8.1)

## 2019-09-06 LAB — FOLATE: Folate: 13.8 ng/mL (ref >5.9)

## 2019-09-06 LAB — VITAMIN B12: Vitamin B-12: 538 pg/mL (ref 180–914)

## 2019-09-06 LAB — HIV ANTIBODY (ROUTINE TESTING W REFLEX): HIV Screen 4th Generation wRfx: NONREACTIVE

## 2019-09-06 LAB — LACTATE DEHYDROGENASE: LDH: 228 U/L — ABNORMAL HIGH (ref 98–192)

## 2019-09-06 LAB — FERRITIN: Ferritin: 249 ng/mL (ref 24–336)

## 2019-09-06 LAB — IMMATURE PLATELET FRACTION: Immature Platelet Fraction: 3.2 % (ref 1.2–8.6)

## 2019-09-06 NOTE — Progress Notes (Signed)
Hematology/Oncology Consult note [facility] Telephone:([phone] [phone]   Patient Care Team: Tomasita Morrow, MD as PCP - General (Family Medicine) Tomasita Morrow, MD (Family Medicine) Christene Lye, MD (General Surgery)  REFERRING PROVIDER: Frazier Richards, MD  CHIEF COMPLAINTS/REASON FOR VISIT:  Evaluation of anemia, leukopenia and thrombocytopenia  HISTORY OF PRESENTING ILLNESS:  Samuel Mcgee is a 78 y.o. male who was seen in consultation at the request of Adamo, Hattie Perch, MD for evaluation of anemia, leukopenia, thrombocytopenia/pancytopenia  Reviewed patient's labs done previously.  There was no recent blood work done in current  EMR. Blood work from primary care provider's office, was scanned in epic. 08/12/2019 Labs showed WBC 3.2, hemoglobin 12.4, hematocrit 89, decreased platelet counts at 10 4000. CMP showed BUN 30, creatinine 1.16, calcium 9.4, potassium 4.7, sodium 143, albumin 4.1, Reviewing patient's past blood work done in 2015.  His blood count were completely normal in March 2015. Unknown onset of pancytopenia.  Per patient he gets blood work done every 6 months with primary care provider and he has been told that his counts are low for about a year.  Associated symptoms or signs:  Denies weight loss, fever, chills, fatigue, night sweats.  Denies hematochezia, hematuria, hematemesis, epistaxis, black tarry stool.  Denies easy bruising.   Context:  History of hepatitis or HIV infection,  History of gastric bypass denies History of autoimmune disease.  Denies Chronic fatigue at baseline.Appetite is fair Denies , fever, chills, fatigue, night sweats.  He mentions that his weight fluctuates. -Colonoscopy 12/15/2016 showed small polyps in the cecum.  Removed and retrieved.  Nonbleeding internal hemorrhoids.  Enlarged prostate found on digital rectal exam.  Pathology showed villous adenoma Lives at home with wife.   Review of Systems  Constitutional: Negative for appetite change, chills, fever and unexpected weight change.  HENT:   Negative for hearing loss and voice change.   Eyes: Negative for eye problems and icterus.  Respiratory: Negative for chest tightness, cough and shortness of breath.   Cardiovascular: Negative for chest pain and leg swelling.  Gastrointestinal: Negative for abdominal distention and abdominal pain.  Endocrine: Negative for hot flashes.  Genitourinary: Negative for difficulty urinating, dysuria and frequency.   Musculoskeletal: Negative for arthralgias.  Skin: Negative for itching and rash.  Neurological: Negative for light-headedness and numbness.  Hematological: Negative for adenopathy. Does not bruise/bleed easily.  Psychiatric/Behavioral: Negative for confusion.    MEDICAL HISTORY:  Past Medical History:  Diagnosis Date  . Arthritis   . Cardiomyopathy ([place])   . Heart disease   . Hyperlipidemia   . Hypertension   . Inguinal hernia   . Myocardial infarction [facility])     SURGICAL HISTORY: Past Surgical History:  Procedure Laterality Date  . COLONOSCOPY WITH PROPOFOL N/A 12/15/2016   Procedure: COLONOSCOPY WITH PROPOFOL;  Surgeon: Lollie Sails, MD;  Location: [facility] ENDOSCOPY;  Service: Endoscopy;  Laterality: N/A;  . CORONARY ANGIOPLASTY WITH STENT PLACEMENT    . HERNIA REPAIR Left 2003?  Marland Kitchen HERNIA REPAIR Bilateral 04-03-14   inguinal  . INGUINAL HERNIA REPAIR Bilateral 04-03-14  . LIPOMA EXCISION Right 2008   right posterior neck    SOCIAL HISTORY: Social History   Socioeconomic History  . Marital status: Married    Spouse name: Not on file  . Number of children: Not on file  . Years of education: Not on file  . Highest education level: Not on file  Occupational History  . Not on file  Social Needs  . Financial resource strain: Not on file  . Food insecurity    Worry: Not on file    Inability: Not on file  . Transportation needs    Medical: Not  on file    Non-medical: Not on file  Tobacco Use  . Smoking status: Never Smoker  . Smokeless tobacco: Never Used  Substance and Sexual Activity  . Alcohol use: No  . Drug use: No  . Sexual activity: Not on file  Lifestyle  . Physical activity    Days per week: Not on file    Minutes per session: Not on file  . Stress: Not on file  Relationships  . Social Herbalist on phone: Not on file    Gets together: Not on file    Attends religious service: Not on file    Active member of club or organization: Not on file    Attends meetings of clubs or organizations: Not on file    Relationship status: Not on file  . Intimate partner violence    Fear of current or ex partner: Not on file    Emotionally abused: Not on file    Physically abused: Not on file    Forced sexual activity: Not on file  Other Topics Concern  . Not on file  Social History Narrative  . Not on file    FAMILY HISTORY: History reviewed. No pertinent family history. Denies any family history. ALLERGIES:  is allergic to lisinopril.  MEDICATIONS:  Current Outpatient Medications  Medication Sig Dispense Refill  . aspirin 81 MG tablet Take 81 mg by mouth daily.    Marland Kitchen atorvastatin (LIPITOR) 80 MG tablet Take 80 mg by mouth daily.    . brimonidine (ALPHAGAN) 0.2 % ophthalmic solution INSTILL ONE DROP IN EACH EYE BID.    Marland Kitchen carvedilol (COREG) 6.25 MG tablet Take 6.25 mg by mouth 2 (two) times daily with a meal.    . clopidogrel (PLAVIX) 75 MG tablet Take 75 mg by mouth daily with breakfast.    . fluticasone (FLONASE) 50 MCG/ACT nasal spray     . latanoprost (XALATAN) 0.005 % ophthalmic solution 1 drop at bedtime.    Marland Kitchen lisinopril (PRINIVIL,ZESTRIL) 10 MG tablet Take 10 mg by mouth daily.    . polyethylene glycol powder (GLYCOLAX/MIRALAX) 17 GM/SCOOP powder Take as directed for colonic prep.    . predniSONE (DELTASONE) 10 MG tablet Take 10 mg by mouth daily with breakfast.    . triamcinolone (KENALOG) 0.025  % cream Apply 1 application topically 2 (two) times daily.     No current facility-administered medications for this visit.      PHYSICAL EXAMINATION: ECOG PERFORMANCE STATUS: 1 - Symptomatic but completely ambulatory Vitals:   09/06/19 0954  BP: 114/71  Pulse: (!) 53  Resp: 16  Temp: (!) 96.5 F (35.8 C)   Filed Weights   09/06/19 0954  Weight: 130 lb 3.2 oz (59.1 kg)    Physical Exam Constitutional:      General: He is not in acute distress.    Comments: Thin, walk independantly  HENT:     Head: Normocephalic and atraumatic.  Eyes:     General: No scleral icterus.    Pupils: Pupils are equal, round, and reactive to light.  Neck:     Musculoskeletal: Normal range of motion and neck supple.  Cardiovascular:     Rate and Rhythm: Normal rate and regular rhythm.     Heart sounds:  Normal heart sounds.  Pulmonary:     Effort: Pulmonary effort is normal. No respiratory distress.     Breath sounds: No wheezing.  Abdominal:     General: Bowel sounds are normal. There is no distension.     Palpations: Abdomen is soft. There is no mass.     Tenderness: There is no abdominal tenderness.  Musculoskeletal: Normal range of motion.        General: No deformity.  Skin:    General: Skin is warm and dry.     Findings: No erythema or rash.  Neurological:     Mental Status: He is alert and oriented to person, place, and time.     Cranial Nerves: No cranial nerve deficit.     Coordination: Coordination normal.  Psychiatric:        Behavior: Behavior normal.        Thought Content: Thought content normal.      LABORATORY DATA:  I have reviewed the data as listed Lab Results  Component Value Date   WBC 3.6 (L) 09/06/2019   HGB 11.8 (L) 09/06/2019   HCT 37.0 (L) 09/06/2019   MCV 90.0 09/06/2019   PLT 135 (L) 09/06/2019   Recent Labs    09/06/19 1059  NA 141  K 4.6  CL 107  CO2 27  GLUCOSE 119*  BUN 25*  CREATININE 1.00  CALCIUM 9.0  GFRNONAA >60  GFRAA >60   PROT 7.3  ALBUMIN 3.5  AST 28  ALT 25  ALKPHOS 60  BILITOT 0.7   Iron/TIBC/Ferritin/ %Sat    Component Value Date/Time   IRON 49 09/06/2019 1059   TIBC 265 09/06/2019 1059   FERRITIN 249 09/06/2019 1059   IRONPCTSAT 19 09/06/2019 1059      RADIOGRAPHIC STUDIES: I have personally reviewed the radiological images as listed and agreed with the findings in the report.  No results found.   ASSESSMENT & PLAN:  1. Normocytic anemia   2. Thrombocytopenia (HCC)   3. Leukopenia, unspecified type   Labs are reviewed and discussed with patient. Clinically he is doing well without significant constitutional symptoms. For the work up of patient's pancytopenia, I recommend checking CBC;CMP, LDH; smear review, folate, Vitamin B12, hepatitis, HIV,flowcytometry   Also, discussed with the patient that if no clear etiology found- bone marrow biopsy would be suggested. Currently await for the above workup.   # Patient follow-up with me in approximately 2 weeks to review the above results.   Orders Placed This Encounter  Procedures  . CBC with Differential/Platelet    Standing Status:   Future    Number of Occurrences:   1    Standing Expiration Date:   09/05/2020  . Comprehensive metabolic panel    Standing Status:   Future    Number of Occurrences:   1    Standing Expiration Date:   09/05/2020  . Technologist smear review    Standing Status:   Future    Number of Occurrences:   1    Standing Expiration Date:   09/05/2020  . Vitamin B12    Standing Status:   Future    Number of Occurrences:   1    Standing Expiration Date:   09/05/2020  . Folate    Standing Status:   Future    Number of Occurrences:   1    Standing Expiration Date:   09/05/2020  . Lactate dehydrogenase    Standing Status:   Future  Number of Occurrences:   1    Standing Expiration Date:   09/05/2020  . Retic Panel    Standing Status:   Future    Number of Occurrences:   1    Standing Expiration Date:    09/05/2020  . Immature Platelet Fraction    Standing Status:   Future    Number of Occurrences:   1    Standing Expiration Date:   09/05/2020  . Iron and TIBC    Standing Status:   Future    Number of Occurrences:   1    Standing Expiration Date:   09/05/2020  . Ferritin    Standing Status:   Future    Number of Occurrences:   1    Standing Expiration Date:   09/05/2020  . Flow cytometry panel-leukemia/lymphoma work-up    Standing Status:   Future    Number of Occurrences:   1    Standing Expiration Date:   09/05/2020  . Hepatitis panel, acute    Standing Status:   Future    Number of Occurrences:   1    Standing Expiration Date:   09/05/2020  . HIV Antibody (routine testing w rflx)    Standing Status:   Future    Number of Occurrences:   1    Standing Expiration Date:   09/05/2020  . TSH    Standing Status:   Future    Number of Occurrences:   1    Standing Expiration Date:   09/05/2020    All questions were answered. The patient knows to call the clinic with any problems questions or concerns.  Cc Frazier Richards, MD  Return of visit: 2 weeks.  Thank you for this kind referral and the opportunity to participate in the care of this patient. A copy of today's note is routed to referring provider  Total face to face encounter time for this patient visit was 45 min. >50% of the time was  spent in counseling and coordination of care.    Earlie Server, MD, PhD 09/06/2019

## 2019-09-06 NOTE — Progress Notes (Signed)
Patient does not offer any problems today.  

## 2019-09-11 LAB — COMP PANEL: LEUKEMIA/LYMPHOMA: CLINICAL INFO: 21

## 2019-09-19 ENCOUNTER — Other Ambulatory Visit: Payer: Self-pay

## 2019-09-19 NOTE — Progress Notes (Signed)
Patient was prescreened today during medication reconciliation he could not remember what BP medication he is taking. Asked him to please bring vial tomorrow.

## 2019-09-20 ENCOUNTER — Other Ambulatory Visit: Payer: Self-pay

## 2019-09-20 ENCOUNTER — Encounter: Payer: Self-pay | Admitting: Oncology

## 2019-09-20 ENCOUNTER — Inpatient Hospital Stay: Payer: Medicare HMO | Attending: Oncology | Admitting: Oncology

## 2019-09-20 VITALS — BP 117/72 | HR 62 | Temp 98.1°F | Resp 16 | Wt 134.4 lb

## 2019-09-20 DIAGNOSIS — D120 Benign neoplasm of cecum: Secondary | ICD-10-CM | POA: Insufficient documentation

## 2019-09-20 DIAGNOSIS — Z888 Allergy status to other drugs, medicaments and biological substances status: Secondary | ICD-10-CM | POA: Diagnosis not present

## 2019-09-20 DIAGNOSIS — I252 Old myocardial infarction: Secondary | ICD-10-CM | POA: Diagnosis not present

## 2019-09-20 DIAGNOSIS — D696 Thrombocytopenia, unspecified: Secondary | ICD-10-CM | POA: Insufficient documentation

## 2019-09-20 DIAGNOSIS — D649 Anemia, unspecified: Secondary | ICD-10-CM | POA: Diagnosis not present

## 2019-09-20 DIAGNOSIS — M1990 Unspecified osteoarthritis, unspecified site: Secondary | ICD-10-CM | POA: Insufficient documentation

## 2019-09-20 DIAGNOSIS — K648 Other hemorrhoids: Secondary | ICD-10-CM | POA: Insufficient documentation

## 2019-09-20 DIAGNOSIS — Z79899 Other long term (current) drug therapy: Secondary | ICD-10-CM | POA: Diagnosis not present

## 2019-09-20 DIAGNOSIS — D72819 Decreased white blood cell count, unspecified: Secondary | ICD-10-CM | POA: Insufficient documentation

## 2019-09-20 DIAGNOSIS — N400 Enlarged prostate without lower urinary tract symptoms: Secondary | ICD-10-CM | POA: Diagnosis not present

## 2019-09-20 NOTE — Progress Notes (Signed)
Hematology/Oncology Consult note [facility] Telephone:([phone] [phone]   Patient Care Team: Tomasita Morrow, MD as PCP - General (Family Medicine) Tomasita Morrow, MD (Family Medicine) Christene Lye, MD (General Surgery)  REFERRING PROVIDER: Tomasita Morrow, MD  CHIEF COMPLAINTS/REASON FOR VISIT:  Evaluation of anemia, leukopenia and thrombocytopenia  HISTORY OF PRESENTING ILLNESS:  Samuel Mcgee is a 78 y.o. male who was seen in consultation at the request of Tomasita Morrow, MD for evaluation of anemia, leukopenia, thrombocytopenia/pancytopenia  Reviewed patient's labs done previously.  There was no recent blood work done in current  EMR. Blood work from primary care provider's office, was scanned in epic. 08/12/2019 Labs showed WBC 3.2, hemoglobin 12.4, hematocrit 89, decreased platelet counts at 10 4000. CMP showed BUN 30, creatinine 1.16, calcium 9.4, potassium 4.7, sodium 143, albumin 4.1, Reviewing patient's past blood work done in 2015.  His blood count were completely normal in March 2015. Unknown onset of pancytopenia.  Per patient he gets blood work done every 6 months with primary care provider and he has been told that his counts are low for about a year.  Associated symptoms or signs:  Denies weight loss, fever, chills, fatigue, night sweats.  Denies hematochezia, hematuria, hematemesis, epistaxis, black tarry stool.  Denies easy bruising.   Context:  History of hepatitis or HIV infection,  History of gastric bypass denies History of autoimmune disease.  Denies Chronic fatigue at baseline.Appetite is fair Denies , fever, chills, fatigue, night sweats.  He mentions that his weight fluctuates. -Colonoscopy 12/15/2016 showed small polyps in the cecum.  Removed and retrieved.  Nonbleeding internal hemorrhoids.  Enlarged prostate found on digital rectal exam.  Pathology showed villous adenoma Lives at home with wife.   INTERVAL HISTORY [name] is a 78 y.o. male who has above history reviewed by me today presents for follow up visit for management of pancytopenia.  Problems and complaints are listed below: He had blood work up done since last visit. Presents to discuss results.  Denies weight loss, fever, chills, fatigue, night sweats.    Review of Systems  Constitutional: Negative for appetite change, chills, fever and unexpected weight change.  HENT:   Negative for hearing loss and voice change.   Eyes: Negative for eye problems and icterus.  Respiratory: Negative for chest tightness, cough and shortness of breath.   Cardiovascular: Negative for chest pain and leg swelling.  Gastrointestinal: Negative for abdominal distention and abdominal pain.  Endocrine: Negative for hot flashes.  Genitourinary: Negative for difficulty urinating, dysuria and frequency.   Musculoskeletal: Negative for arthralgias.  Skin: Negative for itching and rash.  Neurological: Negative for light-headedness and numbness.  Hematological: Negative for adenopathy. Does not bruise/bleed easily.  Psychiatric/Behavioral: Negative for confusion.    MEDICAL HISTORY:  Past Medical History:  Diagnosis Date  . Arthritis   . Cardiomyopathy ([place])   . Heart disease   . Hyperlipidemia   . Hypertension   . Inguinal hernia   . Myocardial infarction [facility])     SURGICAL HISTORY: Past Surgical History:  Procedure Laterality Date  . COLONOSCOPY WITH PROPOFOL N/A 12/15/2016   Procedure: COLONOSCOPY WITH PROPOFOL;  Surgeon: Lollie Sails, MD;  Location: [facility] ENDOSCOPY;  Service: Endoscopy;  Laterality: N/A;  . CORONARY ANGIOPLASTY WITH STENT PLACEMENT    . HERNIA REPAIR Left 2003?  Marland Kitchen HERNIA REPAIR Bilateral 04-03-14   inguinal  . INGUINAL HERNIA REPAIR Bilateral 04-03-14  . LIPOMA EXCISION Right 2008   right posterior  neck    SOCIAL HISTORY: Social History   Socioeconomic History  . Marital status: Married     Spouse name: Not on file  . Number of children: Not on file  . Years of education: Not on file  . Highest education level: Not on file  Occupational History  . Not on file  Social Needs  . Financial resource strain: Not on file  . Food insecurity    Worry: Not on file    Inability: Not on file  . Transportation needs    Medical: Not on file    Non-medical: Not on file  Tobacco Use  . Smoking status: Never Smoker  . Smokeless tobacco: Never Used  Substance and Sexual Activity  . Alcohol use: No  . Drug use: No  . Sexual activity: Not on file  Lifestyle  . Physical activity    Days per week: Not on file    Minutes per session: Not on file  . Stress: Not on file  Relationships  . Social Herbalist on phone: Not on file    Gets together: Not on file    Attends religious service: Not on file    Active member of club or organization: Not on file    Attends meetings of clubs or organizations: Not on file    Relationship status: Not on file  . Intimate partner violence    Fear of current or ex partner: Not on file    Emotionally abused: Not on file    Physically abused: Not on file    Forced sexual activity: Not on file  Other Topics Concern  . Not on file  Social History Narrative  . Not on file    FAMILY HISTORY: History reviewed. No pertinent family history. Denies any family history. ALLERGIES:  is allergic to lisinopril.  MEDICATIONS:  Current Outpatient Medications  Medication Sig Dispense Refill  . aspirin 81 MG tablet Take 81 mg by mouth daily.    Marland Kitchen atorvastatin (LIPITOR) 80 MG tablet Take 80 mg by mouth daily.    . brimonidine (ALPHAGAN) 0.2 % ophthalmic solution INSTILL ONE DROP IN EACH EYE BID.    Marland Kitchen carvedilol (COREG) 6.25 MG tablet Take 6.25 mg by mouth 2 (two) times daily with a meal.    . clopidogrel (PLAVIX) 75 MG tablet Take 75 mg by mouth daily with breakfast.    . fluticasone (FLONASE) 50 MCG/ACT nasal spray     . latanoprost (XALATAN)  0.005 % ophthalmic solution 1 drop at bedtime.    . polyethylene glycol powder (GLYCOLAX/MIRALAX) 17 GM/SCOOP powder Take as directed for colonic prep.    . triamcinolone (KENALOG) 0.025 % cream Apply 1 application topically 2 (two) times daily.     No current facility-administered medications for this visit.      PHYSICAL EXAMINATION: ECOG PERFORMANCE STATUS: 1 - Symptomatic but completely ambulatory Vitals:   09/20/19 1253  BP: 117/72  Pulse: 62  Resp: 16  Temp: 98.1 F (36.7 C)   Filed Weights   09/20/19 1253  Weight: 134 lb 6.4 oz (61 kg)    Physical Exam Constitutional:      General: He is not in acute distress.    Comments: Thin, walk independantly  HENT:     Head: Normocephalic and atraumatic.  Eyes:     General: No scleral icterus.    Pupils: Pupils are equal, round, and reactive to light.  Neck:     Musculoskeletal: Normal range of  motion and neck supple.  Cardiovascular:     Rate and Rhythm: Normal rate and regular rhythm.     Heart sounds: Normal heart sounds.  Pulmonary:     Effort: Pulmonary effort is normal. No respiratory distress.     Breath sounds: No wheezing.  Abdominal:     General: Bowel sounds are normal. There is no distension.     Palpations: Abdomen is soft. There is no mass.     Tenderness: There is no abdominal tenderness.  Musculoskeletal: Normal range of motion.        General: No deformity.  Skin:    General: Skin is warm and dry.     Findings: No erythema or rash.  Neurological:     Mental Status: He is alert and oriented to person, place, and time.     Cranial Nerves: No cranial nerve deficit.     Coordination: Coordination normal.  Psychiatric:        Behavior: Behavior normal.        Thought Content: Thought content normal.      LABORATORY DATA:  I have reviewed the data as listed Lab Results  Component Value Date   WBC 3.6 (L) 09/06/2019   HGB 11.8 (L) 09/06/2019   HCT 37.0 (L) 09/06/2019   MCV 90.0 09/06/2019    PLT 135 (L) 09/06/2019   Recent Labs    09/06/19 1059  NA 141  K 4.6  CL 107  CO2 27  GLUCOSE 119*  BUN 25*  CREATININE 1.00  CALCIUM 9.0  GFRNONAA >60  GFRAA >60  PROT 7.3  ALBUMIN 3.5  AST 28  ALT 25  ALKPHOS 60  BILITOT 0.7   Iron/TIBC/Ferritin/ %Sat    Component Value Date/Time   IRON 49 09/06/2019 1059   TIBC 265 09/06/2019 1059   FERRITIN 249 09/06/2019 1059   IRONPCTSAT 19 09/06/2019 1059      RADIOGRAPHIC STUDIES: I have personally reviewed the radiological images as listed and agreed with the findings in the report.  No results found.   ASSESSMENT & PLAN:  1. Normocytic anemia   2. Thrombocytopenia (HCC)   3. Leukopenia, unspecified type   Labs are reviewed and discussed with patient. Patient has normal folate and B12 Negative peripheral blood flow cytometry.  Negative hepatitis and HIV panel Normal thyroid function. Immature platelet fraction normal. Smear showed normal morphology Normal LDH. I had a discussion with the patient that her peripheral blood work-up so far is negative.  No obvious reason was discovered to explain pancytopenia. 1 option will be proceeding with bone marrow biopsy to rule out MDS.  Patient is not enthusiastic about this option. I think given the mild degree of cytopenia, observation is reasonable. Repeat blood work in 3 months and follow-up in the clinic.  Orders Placed This Encounter  Procedures  . CBC with Differential/Platelet    Standing Status:   Future    Standing Expiration Date:   09/19/2020  . Comprehensive metabolic panel    Standing Status:   Future    Standing Expiration Date:   09/19/2020    All questions were answered. The patient knows to call the clinic with any problems questions or concerns.  Cc Tomasita Morrow, MD  Return of visit: 3 months.    Earlie Server, MD, PhD 09/20/2019

## 2019-09-20 NOTE — Progress Notes (Signed)
Patient does not offer any problems today.  

## 2019-12-18 ENCOUNTER — Other Ambulatory Visit: Payer: Self-pay

## 2019-12-18 ENCOUNTER — Inpatient Hospital Stay (HOSPITAL_BASED_OUTPATIENT_CLINIC_OR_DEPARTMENT_OTHER): Payer: Medicare HMO | Admitting: Oncology

## 2019-12-18 ENCOUNTER — Inpatient Hospital Stay: Payer: Medicare HMO | Attending: Oncology

## 2019-12-18 ENCOUNTER — Encounter: Payer: Self-pay | Admitting: Oncology

## 2019-12-18 VITALS — BP 139/73 | HR 55 | Temp 96.9°F | Resp 16 | Wt 139.9 lb

## 2019-12-18 DIAGNOSIS — D120 Benign neoplasm of cecum: Secondary | ICD-10-CM | POA: Diagnosis not present

## 2019-12-18 DIAGNOSIS — D72819 Decreased white blood cell count, unspecified: Secondary | ICD-10-CM | POA: Diagnosis present

## 2019-12-18 DIAGNOSIS — D696 Thrombocytopenia, unspecified: Secondary | ICD-10-CM

## 2019-12-18 DIAGNOSIS — R5383 Other fatigue: Secondary | ICD-10-CM | POA: Diagnosis not present

## 2019-12-18 DIAGNOSIS — Z21 Asymptomatic human immunodeficiency virus [HIV] infection status: Secondary | ICD-10-CM | POA: Diagnosis not present

## 2019-12-18 DIAGNOSIS — D649 Anemia, unspecified: Secondary | ICD-10-CM | POA: Diagnosis present

## 2019-12-18 DIAGNOSIS — Z79899 Other long term (current) drug therapy: Secondary | ICD-10-CM | POA: Insufficient documentation

## 2019-12-18 DIAGNOSIS — I252 Old myocardial infarction: Secondary | ICD-10-CM | POA: Diagnosis not present

## 2019-12-18 DIAGNOSIS — K648 Other hemorrhoids: Secondary | ICD-10-CM | POA: Insufficient documentation

## 2019-12-18 DIAGNOSIS — N400 Enlarged prostate without lower urinary tract symptoms: Secondary | ICD-10-CM | POA: Diagnosis not present

## 2019-12-18 DIAGNOSIS — Z888 Allergy status to other drugs, medicaments and biological substances status: Secondary | ICD-10-CM | POA: Insufficient documentation

## 2019-12-18 DIAGNOSIS — Z9884 Bariatric surgery status: Secondary | ICD-10-CM | POA: Diagnosis not present

## 2019-12-18 LAB — CBC WITH DIFFERENTIAL/PLATELET
Abs Immature Granulocytes: 0.01 10*3/uL (ref 0.00–0.07)
Basophils Absolute: 0 10*3/uL (ref 0.0–0.1)
Basophils Relative: 1 %
Eosinophils Absolute: 0.2 10*3/uL (ref 0.0–0.5)
Eosinophils Relative: 3 %
HCT: 39.5 % (ref 39.0–52.0)
Hemoglobin: 12.3 g/dL — ABNORMAL LOW (ref 13.0–17.0)
Immature Granulocytes: 0 %
Lymphocytes Relative: 26 %
Lymphs Abs: 1.3 10*3/uL (ref 0.7–4.0)
MCH: 28.1 pg (ref 26.0–34.0)
MCHC: 31.1 g/dL (ref 30.0–36.0)
MCV: 90.2 fL (ref 80.0–100.0)
Monocytes Absolute: 0.6 10*3/uL (ref 0.1–1.0)
Monocytes Relative: 12 %
Neutro Abs: 2.9 10*3/uL (ref 1.7–7.7)
Neutrophils Relative %: 58 %
Platelets: 141 10*3/uL — ABNORMAL LOW (ref 150–400)
RBC: 4.38 MIL/uL (ref 4.22–5.81)
RDW: 14.3 % (ref 11.5–15.5)
WBC: 5 10*3/uL (ref 4.0–10.5)
nRBC: 0 % (ref 0.0–0.2)

## 2019-12-18 LAB — COMPREHENSIVE METABOLIC PANEL
ALT: 20 U/L (ref 0–44)
AST: 24 U/L (ref 15–41)
Albumin: 3.6 g/dL (ref 3.5–5.0)
Alkaline Phosphatase: 61 U/L (ref 38–126)
Anion gap: 6 (ref 5–15)
BUN: 29 mg/dL — ABNORMAL HIGH (ref 8–23)
CO2: 27 mmol/L (ref 22–32)
Calcium: 9.1 mg/dL (ref 8.9–10.3)
Chloride: 106 mmol/L (ref 98–111)
Creatinine, Ser: 1.17 mg/dL (ref 0.61–1.24)
GFR calc Af Amer: >60 mL/min (ref >60)
GFR calc non Af Amer: 59 mL/min — ABNORMAL LOW (ref >60)
Glucose, Bld: 90 mg/dL (ref 70–99)
Potassium: 4.5 mmol/L (ref 3.5–5.1)
Sodium: 139 mmol/L (ref 135–145)
Total Bilirubin: 0.5 mg/dL (ref 0.3–1.2)
Total Protein: 7 g/dL (ref 6.5–8.1)

## 2019-12-18 NOTE — Progress Notes (Signed)
Hematology/Oncology Consult note [facility] [phone] Fax:([phone]   Patient Care Team: Tomasita Morrow, MD as PCP - General (Family Medicine) Tomasita Morrow, MD (Family Medicine) Christene Lye, MD (General Surgery)  REFERRING PROVIDER: Tomasita Morrow, MD  CHIEF COMPLAINTS/REASON FOR VISIT:  Evaluation of anemia, leukopenia and thrombocytopenia  HISTORY OF PRESENTING ILLNESS:  Samuel Mcgee is a 79 y.o. male who was seen in consultation at the request of Tomasita Morrow, MD for evaluation of anemia, leukopenia, thrombocytopenia/pancytopenia  Reviewed patient's labs done previously.  There was no recent blood work done in current  EMR. Blood work from primary care provider's office, was scanned in epic. 08/12/2019 Labs showed WBC 3.2, hemoglobin 12.4, hematocrit 89, decreased platelet counts at 10 4000. CMP showed BUN 30, creatinine 1.16, calcium 9.4, potassium 4.7, sodium 143, albumin 4.1, Reviewing patient's past blood work done in 2015.  His blood count were completely normal in March 2015. Unknown onset of pancytopenia.  Per patient he gets blood work done every 6 months with primary care provider and he has been told that his counts are low for about a year.  Associated symptoms or signs:  Denies weight loss, fever, chills, fatigue, night sweats.  Denies hematochezia, hematuria, hematemesis, epistaxis, black tarry stool.  Denies easy bruising.   Context:  History of hepatitis or HIV infection,  History of gastric bypass denies History of autoimmune disease.  Denies Chronic fatigue at baseline.Appetite is fair Denies , fever, chills, fatigue, night sweats.  He mentions that his weight fluctuates. -Colonoscopy 12/15/2016 showed small polyps in the cecum.  Removed and retrieved.  Nonbleeding internal hemorrhoids.  Enlarged prostate found on digital rectal exam.  Pathology showed villous adenoma Lives at home with  wife.  INTERVAL HISTORY Samuel Mcgee is a 79 y.o. male who has above history reviewed by me today presents for follow up visit for management of pancytopenia.  Problems and complaints are listed below: Patient denies any new complaints today. Denies any weight loss, fever, chills, fatigue, night sweats.  He has gained 5 pounds since last visit.   Review of Systems  Constitutional: Negative for appetite change, chills, fatigue, fever and unexpected weight change.  HENT:   Negative for hearing loss and voice change.   Eyes: Negative for eye problems and icterus.  Respiratory: Negative for chest tightness, cough and shortness of breath.   Cardiovascular: Negative for chest pain and leg swelling.  Gastrointestinal: Negative for abdominal distention and abdominal pain.  Endocrine: Negative for hot flashes.  Genitourinary: Negative for difficulty urinating, dysuria and frequency.   Musculoskeletal: Negative for arthralgias.  Skin: Negative for itching and rash.  Neurological: Negative for light-headedness and numbness.  Hematological: Negative for adenopathy. Does not bruise/bleed easily.  Psychiatric/Behavioral: Negative for confusion.    MEDICAL HISTORY:  Past Medical History:  Diagnosis Date  . Arthritis   . Cardiomyopathy ([place])   . Heart disease   . Hyperlipidemia   . Hypertension   . Inguinal hernia   . Myocardial infarction [facility])     SURGICAL HISTORY: Past Surgical History:  Procedure Laterality Date  . COLONOSCOPY WITH PROPOFOL N/A 12/15/2016   Procedure: COLONOSCOPY WITH PROPOFOL;  Surgeon: Lollie Sails, MD;  Location: [facility] ENDOSCOPY;  Service: Endoscopy;  Laterality: N/A;  . CORONARY ANGIOPLASTY WITH STENT PLACEMENT    . HERNIA REPAIR Left 2003?  Marland Kitchen HERNIA REPAIR Bilateral 04-03-14   inguinal  . INGUINAL HERNIA REPAIR Bilateral 04-03-14  . LIPOMA EXCISION Right 2008  right posterior neck    SOCIAL HISTORY: Social History   Socioeconomic History  .  Marital status: Married    Spouse name: Not on file  . Number of children: Not on file  . Years of education: Not on file  . Highest education level: Not on file  Occupational History  . Not on file  Tobacco Use  . Smoking status: Never Smoker  . Smokeless tobacco: Never Used  Substance and Sexual Activity  . Alcohol use: No  . Drug use: No  . Sexual activity: Not on file  Other Topics Concern  . Not on file  Social History Narrative  . Not on file   Social Determinants of Health   Financial Resource Strain:   . Difficulty of Paying Living Expenses: Not on file  Food Insecurity:   . Worried About Charity fundraiser in the Last Year: Not on file  . Ran Out of Food in the Last Year: Not on file  Transportation Needs:   . Lack of Transportation (Medical): Not on file  . Lack of Transportation (Non-Medical): Not on file  Physical Activity:   . Days of Exercise per Week: Not on file  . Minutes of Exercise per Session: Not on file  Stress:   . Feeling of Stress : Not on file  Social Connections:   . Frequency of Communication with Friends and Family: Not on file  . Frequency of Social Gatherings with Friends and Family: Not on file  . Attends Religious Services: Not on file  . Active Member of Clubs or Organizations: Not on file  . Attends Archivist Meetings: Not on file  . Marital Status: Not on file  Intimate Partner Violence:   . Fear of Current or Ex-Partner: Not on file  . Emotionally Abused: Not on file  . Physically Abused: Not on file  . Sexually Abused: Not on file    FAMILY HISTORY: History reviewed. No pertinent family history. Denies any family history. ALLERGIES:  is allergic to lisinopril.  MEDICATIONS:  Current Outpatient Medications  Medication Sig Dispense Refill  . aspirin 81 MG tablet Take 81 mg by mouth daily.    Marland Kitchen atorvastatin (LIPITOR) 80 MG tablet Take 80 mg by mouth daily.    . brimonidine (ALPHAGAN) 0.2 % ophthalmic solution  INSTILL ONE DROP IN EACH EYE BID.    Marland Kitchen carvedilol (COREG) 6.25 MG tablet Take 6.25 mg by mouth 2 (two) times daily with a meal.    . clopidogrel (PLAVIX) 75 MG tablet Take 75 mg by mouth daily with breakfast.    . fluticasone (FLONASE) 50 MCG/ACT nasal spray     . latanoprost (XALATAN) 0.005 % ophthalmic solution 1 drop at bedtime.    . polyethylene glycol powder (GLYCOLAX/MIRALAX) 17 GM/SCOOP powder Take as directed for colonic prep.    . triamcinolone (KENALOG) 0.025 % cream Apply 1 application topically 2 (two) times daily.     No current facility-administered medications for this visit.     PHYSICAL EXAMINATION: ECOG PERFORMANCE STATUS: 1 - Symptomatic but completely ambulatory Vitals:   12/18/19 1404  BP: 139/73  Pulse: (!) 55  Resp: 16  Temp: (!) 96.9 F (36.1 C)   Filed Weights   12/18/19 1404  Weight: 139 lb 14.4 oz (63.5 kg)    Physical Exam Constitutional:      General: He is not in acute distress.    Comments: Thin, walk independantly  HENT:     Head: Normocephalic and  atraumatic.  Eyes:     General: No scleral icterus.    Pupils: Pupils are equal, round, and reactive to light.  Cardiovascular:     Rate and Rhythm: Normal rate and regular rhythm.     Heart sounds: Normal heart sounds.  Pulmonary:     Effort: Pulmonary effort is normal. No respiratory distress.     Breath sounds: No wheezing.  Abdominal:     General: Bowel sounds are normal. There is no distension.     Palpations: Abdomen is soft. There is no mass.     Tenderness: There is no abdominal tenderness.  Musculoskeletal:        General: No deformity. Normal range of motion.     Cervical back: Normal range of motion and neck supple.  Skin:    General: Skin is warm and dry.     Findings: No erythema or rash.  Neurological:     Mental Status: He is alert and oriented to person, place, and time. Mental status is at baseline.     Cranial Nerves: No cranial nerve deficit.     Coordination:  Coordination normal.  Psychiatric:        Mood and Affect: Mood normal.        Behavior: Behavior normal.        Thought Content: Thought content normal.      LABORATORY DATA:  I have reviewed the data as listed Lab Results  Component Value Date   WBC 5.0 12/18/2019   HGB 12.3 (L) 12/18/2019   HCT 39.5 12/18/2019   MCV 90.2 12/18/2019   PLT 141 (L) 12/18/2019   Recent Labs    09/06/19 1059 12/18/19 1335  NA 141 139  K 4.6 4.5  CL 107 106  CO2 27 27  GLUCOSE 119* 90  BUN 25* 29*  CREATININE 1.00 1.17  CALCIUM 9.0 9.1  GFRNONAA >60 59*  GFRAA >60 >60  PROT 7.3 7.0  ALBUMIN 3.5 3.6  AST 28 24  ALT 25 20  ALKPHOS 60 61  BILITOT 0.7 0.5   Iron/TIBC/Ferritin/ %Sat    Component Value Date/Time   IRON 49 09/06/2019 1059   TIBC 265 09/06/2019 1059   FERRITIN 249 09/06/2019 1059   IRONPCTSAT 19 09/06/2019 1059      RADIOGRAPHIC STUDIES: I have personally reviewed the radiological images as listed and agreed with the findings in the report.  No results found.   ASSESSMENT & PLAN:  1. Normocytic anemia   2. Thrombocytopenia (HCC)   3. Leukopenia, unspecified type   Labs reviewed and discussed with patient. Leukopenia has resolved. Hemoglobin 12.3, improved. Thrombocytopenia platelet count 1 41,000, also improved. No intervention at this point. Follow-up in 6 months.   Orders Placed This Encounter  Procedures  . CBC with Differential/Platelet    Standing Status:   Future    Standing Expiration Date:   12/17/2020  . Comprehensive metabolic panel    Standing Status:   Future    Standing Expiration Date:   12/17/2020    All questions were answered. The patient knows to call the clinic with any problems questions or concerns.  Return of visit: 6 months.    Earlie Server, MD, PhD 12/18/2019

## 2020-06-17 ENCOUNTER — Inpatient Hospital Stay: Payer: Medicare HMO

## 2020-06-17 ENCOUNTER — Inpatient Hospital Stay: Payer: Medicare HMO | Admitting: Oncology

## 2020-06-20 ENCOUNTER — Inpatient Hospital Stay (HOSPITAL_BASED_OUTPATIENT_CLINIC_OR_DEPARTMENT_OTHER): Payer: Medicare HMO | Admitting: Oncology

## 2020-06-20 ENCOUNTER — Other Ambulatory Visit: Payer: Self-pay

## 2020-06-20 ENCOUNTER — Encounter: Payer: Self-pay | Admitting: Oncology

## 2020-06-20 ENCOUNTER — Inpatient Hospital Stay: Payer: Medicare HMO | Attending: Oncology

## 2020-06-20 VITALS — BP 149/93 | HR 43 | Temp 96.4°F | Resp 16 | Wt 131.7 lb

## 2020-06-20 DIAGNOSIS — D696 Thrombocytopenia, unspecified: Secondary | ICD-10-CM | POA: Insufficient documentation

## 2020-06-20 DIAGNOSIS — I252 Old myocardial infarction: Secondary | ICD-10-CM | POA: Diagnosis not present

## 2020-06-20 DIAGNOSIS — D487 Neoplasm of uncertain behavior of other specified sites: Secondary | ICD-10-CM | POA: Diagnosis not present

## 2020-06-20 DIAGNOSIS — D72819 Decreased white blood cell count, unspecified: Secondary | ICD-10-CM | POA: Diagnosis not present

## 2020-06-20 DIAGNOSIS — K648 Other hemorrhoids: Secondary | ICD-10-CM | POA: Insufficient documentation

## 2020-06-20 DIAGNOSIS — D120 Benign neoplasm of cecum: Secondary | ICD-10-CM | POA: Diagnosis not present

## 2020-06-20 DIAGNOSIS — N400 Enlarged prostate without lower urinary tract symptoms: Secondary | ICD-10-CM | POA: Diagnosis not present

## 2020-06-20 DIAGNOSIS — Z79899 Other long term (current) drug therapy: Secondary | ICD-10-CM | POA: Diagnosis not present

## 2020-06-20 DIAGNOSIS — D649 Anemia, unspecified: Secondary | ICD-10-CM

## 2020-06-20 DIAGNOSIS — Z888 Allergy status to other drugs, medicaments and biological substances status: Secondary | ICD-10-CM | POA: Diagnosis not present

## 2020-06-20 DIAGNOSIS — R634 Abnormal weight loss: Secondary | ICD-10-CM | POA: Insufficient documentation

## 2020-06-20 LAB — PATHOLOGIST SMEAR REVIEW

## 2020-06-20 LAB — CBC WITH DIFFERENTIAL/PLATELET
Abs Immature Granulocytes: 0 10*3/uL (ref 0.00–0.07)
Basophils Absolute: 0 10*3/uL (ref 0.0–0.1)
Basophils Relative: 1 %
Eosinophils Absolute: 0.1 10*3/uL (ref 0.0–0.5)
Eosinophils Relative: 4 %
HCT: 37.8 % — ABNORMAL LOW (ref 39.0–52.0)
Hemoglobin: 12.7 g/dL — ABNORMAL LOW (ref 13.0–17.0)
Immature Granulocytes: 0 %
Lymphocytes Relative: 36 %
Lymphs Abs: 1.2 10*3/uL (ref 0.7–4.0)
MCH: 29.3 pg (ref 26.0–34.0)
MCHC: 33.6 g/dL (ref 30.0–36.0)
MCV: 87.3 fL (ref 80.0–100.0)
Monocytes Absolute: 0.6 10*3/uL (ref 0.1–1.0)
Monocytes Relative: 17 %
Neutro Abs: 1.4 10*3/uL — ABNORMAL LOW (ref 1.7–7.7)
Neutrophils Relative %: 42 %
Platelets: 121 10*3/uL — ABNORMAL LOW (ref 150–400)
RBC: 4.33 MIL/uL (ref 4.22–5.81)
RDW: 13.8 % (ref 11.5–15.5)
WBC: 3.4 10*3/uL — ABNORMAL LOW (ref 4.0–10.5)
nRBC: 0 % (ref 0.0–0.2)

## 2020-06-20 LAB — TSH: TSH: 2.189 u[IU]/mL (ref 0.350–4.500)

## 2020-06-20 LAB — COMPREHENSIVE METABOLIC PANEL
ALT: 25 U/L (ref 0–44)
AST: 31 U/L (ref 15–41)
Albumin: 3.7 g/dL (ref 3.5–5.0)
Alkaline Phosphatase: 64 U/L (ref 38–126)
Anion gap: 9 (ref 5–15)
BUN: 23 mg/dL (ref 8–23)
CO2: 25 mmol/L (ref 22–32)
Calcium: 8.8 mg/dL — ABNORMAL LOW (ref 8.9–10.3)
Chloride: 105 mmol/L (ref 98–111)
Creatinine, Ser: 1.16 mg/dL (ref 0.61–1.24)
GFR calc Af Amer: >60 mL/min (ref >60)
GFR calc non Af Amer: 59 mL/min — ABNORMAL LOW (ref >60)
Glucose, Bld: 90 mg/dL (ref 70–99)
Potassium: 4.1 mmol/L (ref 3.5–5.1)
Sodium: 139 mmol/L (ref 135–145)
Total Bilirubin: 0.9 mg/dL (ref 0.3–1.2)
Total Protein: 6.9 g/dL (ref 6.5–8.1)

## 2020-06-20 NOTE — Progress Notes (Signed)
Patient denies new problems/concerns today.   °

## 2020-06-20 NOTE — Progress Notes (Signed)
Hematology/Oncology follow up note [facility] [phone] [phone]   Patient Care Team: Ulanda Edison, MD as PCP - General (Family Medicine) Ulanda Edison, MD (Family Medicine) Kieth Brightly, MD (General Surgery)  REFERRING PROVIDER: Ulanda Edison, MD  CHIEF COMPLAINTS/REASON FOR VISIT:  Evaluation of anemia, leukopenia and thrombocytopenia  HISTORY OF PRESENTING ILLNESS:  Samuel Mcgee is a 79 y.o. male who was seen in consultation at the request of Ulanda Edison, MD for evaluation of anemia, leukopenia, thrombocytopenia/pancytopenia  Reviewed patient's labs done previously.  There was no recent blood work done in current  EMR. Blood work from primary care provider's office, was scanned in epic. 08/12/2019 Labs showed WBC 3.2, hemoglobin 12.4, hematocrit 89, decreased platelet counts at 10 4000. CMP showed BUN 30, creatinine 1.16, calcium 9.4, potassium 4.7, sodium 143, albumin 4.1, Reviewing patient's past blood work done in 2015.  His blood count were completely normal in March 2015. Unknown onset of pancytopenia.  Per patient he gets blood work done every 6 months with primary care provider and he has been told that his counts are low for about a year.  -Colonoscopy 12/15/2016 showed small polyps in the cecum.  Removed and retrieved.  Nonbleeding internal hemorrhoids.  Enlarged prostate found on digital rectal exam.  Pathology showed villous adenoma Lives at home with wife.  INTERVAL HISTORY Samuel Mcgee is a 79 y.o. male who has above history reviewed by me today presents for follow up visit for management of pancytopenia.  Problems and complaints are listed below: Patient has lost 8 pounds since last visit 6 months ago.  Denies any fever, chills, fatigue, night sweats.  he reports being very active in gardening this summer.  Review of Systems  Constitutional: Positive for unexpected weight change. Negative  for appetite change, chills, fatigue and fever.  HENT:   Negative for hearing loss and voice change.   Eyes: Negative for eye problems and icterus.  Respiratory: Negative for chest tightness, cough and shortness of breath.   Cardiovascular: Negative for chest pain and leg swelling.  Gastrointestinal: Negative for abdominal distention and abdominal pain.  Endocrine: Negative for hot flashes.  Genitourinary: Negative for difficulty urinating, dysuria and frequency.   Musculoskeletal: Negative for arthralgias.  Skin: Negative for itching and rash.  Neurological: Negative for light-headedness and numbness.  Hematological: Negative for adenopathy. Does not bruise/bleed easily.  Psychiatric/Behavioral: Negative for confusion.    MEDICAL HISTORY:  Past Medical History:  Diagnosis Date  . Arthritis   . Cardiomyopathy (HCC)   . Heart disease   . Hyperlipidemia   . Hypertension   . Inguinal hernia   . Myocardial infarction [facility])     SURGICAL HISTORY: Past Surgical History:  Procedure Laterality Date  . COLONOSCOPY WITH PROPOFOL N/A 12/15/2016   Procedure: COLONOSCOPY WITH PROPOFOL;  Surgeon: Christena Deem, MD;  Location: [facility] ENDOSCOPY;  Service: Endoscopy;  Laterality: N/A;  . CORONARY ANGIOPLASTY WITH STENT PLACEMENT    . HERNIA REPAIR Left 2003?  Marland Kitchen HERNIA REPAIR Bilateral 04-03-14   inguinal  . INGUINAL HERNIA REPAIR Bilateral 04-03-14  . LIPOMA EXCISION Right 2008   right posterior neck    SOCIAL HISTORY: Social History   Socioeconomic History  . Marital status: Married    Spouse name: Not on file  . Number of children: Not on file  . Years of education: Not on file  . Highest education level: Not on file  Occupational History  . Not on file  Tobacco  Use  . Smoking status: Never Smoker  . Smokeless tobacco: Never Used  Substance and Sexual Activity  . Alcohol use: No  . Drug use: No  . Sexual activity: Not on file  Other Topics Concern  . Not on file  Social  History Narrative  . Not on file   Social Determinants of Health   Financial Resource Strain:   . Difficulty of Paying Living Expenses:   Food Insecurity:   . Worried About Charity fundraiser in the Last Year:   . Arboriculturist in the Last Year:   Transportation Needs:   . Film/video editor (Medical):   Marland Kitchen Lack of Transportation (Non-Medical):   Physical Activity:   . Days of Exercise per Week:   . Minutes of Exercise per Session:   Stress:   . Feeling of Stress :   Social Connections:   . Frequency of Communication with Friends and Family:   . Frequency of Social Gatherings with Friends and Family:   . Attends Religious Services:   . Active Member of Clubs or Organizations:   . Attends Archivist Meetings:   Marland Kitchen Marital Status:   Intimate Partner Violence:   . Fear of Current or Ex-Partner:   . Emotionally Abused:   Marland Kitchen Physically Abused:   . Sexually Abused:     FAMILY HISTORY: History reviewed. No pertinent family history. Denies any family history. ALLERGIES:  is allergic to lisinopril.  MEDICATIONS:  Current Outpatient Medications  Medication Sig Dispense Refill  . aspirin 81 MG tablet Take 81 mg by mouth daily.    Marland Kitchen atorvastatin (LIPITOR) 80 MG tablet Take 80 mg by mouth daily.    . brimonidine (ALPHAGAN) 0.2 % ophthalmic solution INSTILL ONE DROP IN EACH EYE BID.    Marland Kitchen carvedilol (COREG) 6.25 MG tablet Take 6.25 mg by mouth 2 (two) times daily with a meal.    . clopidogrel (PLAVIX) 75 MG tablet Take 75 mg by mouth daily with breakfast.    . fluticasone (FLONASE) 50 MCG/ACT nasal spray     . latanoprost (XALATAN) 0.005 % ophthalmic solution 1 drop at bedtime.    . polyethylene glycol powder (GLYCOLAX/MIRALAX) 17 GM/SCOOP powder Take as directed for colonic prep.    . triamcinolone (KENALOG) 0.025 % cream Apply 1 application topically 2 (two) times daily.     No current facility-administered medications for this visit.     PHYSICAL  EXAMINATION: ECOG PERFORMANCE STATUS: 1 - Symptomatic but completely ambulatory Vitals:   06/20/20 0944  BP: (!) 149/93  Pulse: (!) 43  Resp: 16  Temp: (!) 96.4 F (35.8 C)   Filed Weights   06/20/20 0944  Weight: 131 lb 11.2 oz (59.7 kg)    Physical Exam Constitutional:      General: He is not in acute distress.    Comments: Thin, walk independantly  HENT:     Head: Normocephalic and atraumatic.  Eyes:     General: No scleral icterus.    Pupils: Pupils are equal, round, and reactive to light.  Cardiovascular:     Rate and Rhythm: Normal rate and regular rhythm.     Heart sounds: Normal heart sounds.  Pulmonary:     Effort: Pulmonary effort is normal. No respiratory distress.     Breath sounds: No wheezing.  Abdominal:     General: Bowel sounds are normal. There is no distension.     Palpations: Abdomen is soft. There is no mass.  Tenderness: There is no abdominal tenderness.  Musculoskeletal:        General: No deformity. Normal range of motion.     Cervical back: Normal range of motion and neck supple.  Skin:    General: Skin is warm and dry.     Findings: No erythema or rash.  Neurological:     Mental Status: He is alert and oriented to person, place, and time. Mental status is at baseline.     Cranial Nerves: No cranial nerve deficit.     Coordination: Coordination normal.  Psychiatric:        Mood and Affect: Mood normal.      LABORATORY DATA:  I have reviewed the data as listed Lab Results  Component Value Date   WBC 3.4 (L) 06/20/2020   HGB 12.7 (L) 06/20/2020   HCT 37.8 (L) 06/20/2020   MCV 87.3 06/20/2020   PLT 121 (L) 06/20/2020   Recent Labs    09/06/19 1059 12/18/19 1335 06/20/20 0933  NA 141 139 139  K 4.6 4.5 4.1  CL 107 106 105  CO2 '27 27 25  '$ GLUCOSE 119* 90 90  BUN 25* 29* 23  CREATININE 1.00 1.17 1.16  CALCIUM 9.0 9.1 8.8*  GFRNONAA >60 59* 59*  GFRAA >60 >60 >60  PROT 7.3 7.0 6.9  ALBUMIN 3.5 3.6 3.7  AST '28 24 31  '$ ALT  '25 20 25  '$ ALKPHOS 60 61 64  BILITOT 0.7 0.5 0.9   Iron/TIBC/Ferritin/ %Sat    Component Value Date/Time   IRON 49 09/06/2019 1059   TIBC 265 09/06/2019 1059   FERRITIN 249 09/06/2019 1059   IRONPCTSAT 19 09/06/2019 1059      RADIOGRAPHIC STUDIES: I have personally reviewed the radiological images as listed and agreed with the findings in the report.  No results found.   ASSESSMENT & PLAN:  1. Thrombocytopenia ([place])   2. Normocytic anemia   3. Leukopenia, unspecified type    Chronic pancytopenia, previous peripheral blood work-up was not remarkable. Platelet and neutropenia are worse compared to 6 months ago.  check pathology smear for further evaluation Unexplained weight loss.  I discussed with patient about bone marrow biopsy and he declined.  We will watch patient closely.  Repeat blood work in 2 months to trend her counts.  Orders Placed This Encounter  Procedures  . TSH    Standing Status:   Future    Standing Expiration Date:   06/20/2021  . Pathologist smear review    Standing Status:   Future    Standing Expiration Date:   06/20/2021  . CBC with Differential/Platelet    Standing Status:   Future    Standing Expiration Date:   06/20/2021  . Comprehensive metabolic panel    Standing Status:   Future    Standing Expiration Date:   06/20/2021  . Lactate dehydrogenase    Standing Status:   Future    Standing Expiration Date:   06/20/2021    All questions were answered. The patient knows to call the clinic with any problems questions or concerns.  Return of visit: 2 months.    Earlie Server, MD, PhD 06/20/2020

## 2020-08-20 ENCOUNTER — Telehealth: Payer: Self-pay

## 2020-08-20 ENCOUNTER — Ambulatory Visit
Admission: RE | Admit: 2020-08-20 | Discharge: 2020-08-20 | Disposition: A | Discharge status: Home / Self Care | Payer: Medicare HMO | Attending: Oncology | Admitting: Oncology

## 2020-08-20 ENCOUNTER — Inpatient Hospital Stay: Payer: Medicare HMO | Attending: Oncology

## 2020-08-20 ENCOUNTER — Other Ambulatory Visit: Payer: Self-pay

## 2020-08-20 ENCOUNTER — Ambulatory Visit
Admission: RE | Admit: 2020-08-20 | Discharge: 2020-08-20 | Disposition: A | Discharge status: Home / Self Care | Payer: Medicare HMO | Source: Ambulatory Visit | Attending: Oncology | Admitting: Oncology

## 2020-08-20 ENCOUNTER — Inpatient Hospital Stay (HOSPITAL_BASED_OUTPATIENT_CLINIC_OR_DEPARTMENT_OTHER): Payer: Medicare HMO | Admitting: Oncology

## 2020-08-20 ENCOUNTER — Encounter: Payer: Self-pay | Admitting: Oncology

## 2020-08-20 VITALS — BP 111/71 | HR 45 | Temp 95.3°F | Resp 16 | Wt 128.2 lb

## 2020-08-20 DIAGNOSIS — D120 Benign neoplasm of cecum: Secondary | ICD-10-CM | POA: Insufficient documentation

## 2020-08-20 DIAGNOSIS — D61818 Other pancytopenia: Secondary | ICD-10-CM | POA: Diagnosis not present

## 2020-08-20 DIAGNOSIS — D649 Anemia, unspecified: Secondary | ICD-10-CM | POA: Insufficient documentation

## 2020-08-20 DIAGNOSIS — D72819 Decreased white blood cell count, unspecified: Secondary | ICD-10-CM | POA: Diagnosis not present

## 2020-08-20 DIAGNOSIS — N400 Enlarged prostate without lower urinary tract symptoms: Secondary | ICD-10-CM | POA: Insufficient documentation

## 2020-08-20 DIAGNOSIS — K648 Other hemorrhoids: Secondary | ICD-10-CM | POA: Insufficient documentation

## 2020-08-20 DIAGNOSIS — D696 Thrombocytopenia, unspecified: Secondary | ICD-10-CM

## 2020-08-20 DIAGNOSIS — Z79899 Other long term (current) drug therapy: Secondary | ICD-10-CM | POA: Diagnosis not present

## 2020-08-20 DIAGNOSIS — R634 Abnormal weight loss: Secondary | ICD-10-CM | POA: Insufficient documentation

## 2020-08-20 DIAGNOSIS — I252 Old myocardial infarction: Secondary | ICD-10-CM | POA: Diagnosis not present

## 2020-08-20 DIAGNOSIS — Z888 Allergy status to other drugs, medicaments and biological substances status: Secondary | ICD-10-CM | POA: Insufficient documentation

## 2020-08-20 LAB — CBC WITH DIFFERENTIAL/PLATELET
Abs Immature Granulocytes: 0.01 10*3/uL (ref 0.00–0.07)
Basophils Absolute: 0 10*3/uL (ref 0.0–0.1)
Basophils Relative: 1 %
Eosinophils Absolute: 0.2 10*3/uL (ref 0.0–0.5)
Eosinophils Relative: 5 %
HCT: 36.9 % — ABNORMAL LOW (ref 39.0–52.0)
Hemoglobin: 12.2 g/dL — ABNORMAL LOW (ref 13.0–17.0)
Immature Granulocytes: 0 %
Lymphocytes Relative: 31 %
Lymphs Abs: 1 10*3/uL (ref 0.7–4.0)
MCH: 29.2 pg (ref 26.0–34.0)
MCHC: 33.1 g/dL (ref 30.0–36.0)
MCV: 88.3 fL (ref 80.0–100.0)
Monocytes Absolute: 0.5 10*3/uL (ref 0.1–1.0)
Monocytes Relative: 16 %
Neutro Abs: 1.5 10*3/uL — ABNORMAL LOW (ref 1.7–7.7)
Neutrophils Relative %: 47 %
Platelets: 137 10*3/uL — ABNORMAL LOW (ref 150–400)
RBC: 4.18 MIL/uL — ABNORMAL LOW (ref 4.22–5.81)
RDW: 14.3 % (ref 11.5–15.5)
WBC: 3.2 10*3/uL — ABNORMAL LOW (ref 4.0–10.5)
nRBC: 0 % (ref 0.0–0.2)

## 2020-08-20 LAB — COMPREHENSIVE METABOLIC PANEL
ALT: 31 U/L (ref 0–44)
AST: 46 U/L — ABNORMAL HIGH (ref 15–41)
Albumin: 3.7 g/dL (ref 3.5–5.0)
Alkaline Phosphatase: 56 U/L (ref 38–126)
Anion gap: 10 (ref 5–15)
BUN: 29 mg/dL — ABNORMAL HIGH (ref 8–23)
CO2: 25 mmol/L (ref 22–32)
Calcium: 8.7 mg/dL — ABNORMAL LOW (ref 8.9–10.3)
Chloride: 104 mmol/L (ref 98–111)
Creatinine, Ser: 1.21 mg/dL (ref 0.61–1.24)
GFR calc non Af Amer: 56 mL/min — ABNORMAL LOW (ref >60)
Glucose, Bld: 106 mg/dL — ABNORMAL HIGH (ref 70–99)
Potassium: 4.2 mmol/L (ref 3.5–5.1)
Sodium: 139 mmol/L (ref 135–145)
Total Bilirubin: 1.1 mg/dL (ref 0.3–1.2)
Total Protein: 7.4 g/dL (ref 6.5–8.1)

## 2020-08-20 LAB — LACTATE DEHYDROGENASE: LDH: 278 U/L — ABNORMAL HIGH (ref 98–192)

## 2020-08-20 NOTE — Progress Notes (Signed)
Patient denies new problems/concerns today.   °

## 2020-08-20 NOTE — Telephone Encounter (Signed)
Samuel Mcgee is scheduled for Bone marrow Bx on Thurs 08/29/20 @ 8:30a, arrive 7:30a. MD follow up with Dr. Tasia Catchings on 10/28 @10 :34. Patient has been notified of both appointments.

## 2020-08-20 NOTE — Progress Notes (Signed)
Hematology/Oncology follow up note [facility] [phone] Fax:(336) [phone]   Patient Care Team: Tomasita Morrow, MD as PCP - General (Family Medicine) Tomasita Morrow, MD (Family Medicine) Christene Lye, MD (General Surgery)  REFERRING PROVIDER: Tomasita Morrow, MD  CHIEF COMPLAINTS/REASON FOR VISIT:  Follow-up for anemia, leukopenia and thrombocytopenia  HISTORY OF PRESENTING ILLNESS:  Samuel Mcgee is a 79 y.o. male who was seen in consultation at the request of Tomasita Morrow, MD for evaluation of anemia, leukopenia, thrombocytopenia/pancytopenia  Reviewed patient's labs done previously.  There was no recent blood work done in current  EMR. Blood work from primary care provider's office, was scanned in epic. 08/12/2019 Labs showed WBC 3.2, hemoglobin 12.4, hematocrit 89, decreased platelet counts at 10 4000. CMP showed BUN 30, creatinine 1.16, calcium 9.4, potassium 4.7, sodium 143, albumin 4.1, Reviewing patient's past blood work done in 2015.  His blood count were completely normal in March 2015. Unknown onset of pancytopenia.  Per patient he gets blood work done every 6 months with primary care provider and he has been told that his counts are low for about a year.  -Colonoscopy 12/15/2016 showed small polyps in the cecum.  Removed and retrieved.  Nonbleeding internal hemorrhoids.  Enlarged prostate found on digital rectal exam.  Pathology showed villous adenoma Lives at home with wife.  INTERVAL HISTORY Samuel Mcgee is a 79 y.o. male who has above history reviewed by me today presents for follow up visit for management of pancytopenia.  Unintentional weight loss Problems and complaints are listed below: Patient has lost Another 4 pounds compared to his visit in August 2021.  At that time he had lost 8 pounds comparing to February 2021.  Patient denies any constitutional symptoms.  He reports that he has good  appetite.   Review of Systems  Constitutional: Positive for unexpected weight change. Negative for appetite change, chills, fatigue and fever.  HENT:   Negative for hearing loss and voice change.   Eyes: Negative for eye problems and icterus.  Respiratory: Negative for chest tightness, cough and shortness of breath.   Cardiovascular: Negative for chest pain and leg swelling.  Gastrointestinal: Negative for abdominal distention and abdominal pain.  Endocrine: Negative for hot flashes.  Genitourinary: Negative for difficulty urinating, dysuria and frequency.   Musculoskeletal: Negative for arthralgias.  Skin: Negative for itching and rash.  Neurological: Negative for light-headedness and numbness.  Hematological: Negative for adenopathy. Does not bruise/bleed easily.  Psychiatric/Behavioral: Negative for confusion.    MEDICAL HISTORY:  Past Medical History:  Diagnosis Date  . Arthritis   . Cardiomyopathy ([place])   . Heart disease   . Hyperlipidemia   . Hypertension   . Inguinal hernia   . Myocardial infarction [facility])     SURGICAL HISTORY: Past Surgical History:  Procedure Laterality Date  . COLONOSCOPY WITH PROPOFOL N/A 12/15/2016   Procedure: COLONOSCOPY WITH PROPOFOL;  Surgeon: Lollie Sails, MD;  Location: [facility] ENDOSCOPY;  Service: Endoscopy;  Laterality: N/A;  . CORONARY ANGIOPLASTY WITH STENT PLACEMENT    . HERNIA REPAIR Left 2003?  Marland Kitchen HERNIA REPAIR Bilateral 04-03-14   inguinal  . INGUINAL HERNIA REPAIR Bilateral 04-03-14  . LIPOMA EXCISION Right 2008   right posterior neck    SOCIAL HISTORY: Social History   Socioeconomic History  . Marital status: Married    Spouse name: Not on file  . Number of children: Not on file  . Years of education: Not on file  . Highest  education level: Not on file  Occupational History  . Not on file  Tobacco Use  . Smoking status: Never Smoker  . Smokeless tobacco: Never Used  Substance and Sexual Activity  . Alcohol use: No   . Drug use: No  . Sexual activity: Not on file  Other Topics Concern  . Not on file  Social History Narrative  . Not on file   Social Determinants of Health   Financial Resource Strain:   . Difficulty of Paying Living Expenses: Not on file  Food Insecurity:   . Worried About Charity fundraiser in the Last Year: Not on file  . Ran Out of Food in the Last Year: Not on file  Transportation Needs:   . Lack of Transportation (Medical): Not on file  . Lack of Transportation (Non-Medical): Not on file  Physical Activity:   . Days of Exercise per Week: Not on file  . Minutes of Exercise per Session: Not on file  Stress:   . Feeling of Stress : Not on file  Social Connections:   . Frequency of Communication with Friends and Family: Not on file  . Frequency of Social Gatherings with Friends and Family: Not on file  . Attends Religious Services: Not on file  . Active Member of Clubs or Organizations: Not on file  . Attends Archivist Meetings: Not on file  . Marital Status: Not on file  Intimate Partner Violence:   . Fear of Current or Ex-Partner: Not on file  . Emotionally Abused: Not on file  . Physically Abused: Not on file  . Sexually Abused: Not on file    FAMILY HISTORY: History reviewed. No pertinent family history. Denies any family history. ALLERGIES:  is allergic to lisinopril.  MEDICATIONS:  Current Outpatient Medications  Medication Sig Dispense Refill  . aspirin 81 MG tablet Take 81 mg by mouth daily.    Marland Kitchen atorvastatin (LIPITOR) 80 MG tablet Take 80 mg by mouth daily.    . brimonidine (ALPHAGAN) 0.2 % ophthalmic solution INSTILL ONE DROP IN EACH EYE BID.    Marland Kitchen carvedilol (COREG) 6.25 MG tablet Take 6.25 mg by mouth 2 (two) times daily with a meal.    . clopidogrel (PLAVIX) 75 MG tablet Take 75 mg by mouth daily with breakfast.    . fluticasone (FLONASE) 50 MCG/ACT nasal spray     . latanoprost (XALATAN) 0.005 % ophthalmic solution 1 drop at bedtime.     . triamcinolone (KENALOG) 0.025 % cream Apply 1 application topically 2 (two) times daily.    . polyethylene glycol powder (GLYCOLAX/MIRALAX) 17 GM/SCOOP powder Take as directed for colonic prep. (Patient not taking: Reported on 08/20/2020)     No current facility-administered medications for this visit.     PHYSICAL EXAMINATION: ECOG PERFORMANCE STATUS: 1 - Symptomatic but completely ambulatory Vitals:   08/20/20 1012  BP: 111/71  Pulse: (!) 45  Resp: 16  Temp: (!) 95.3 F (35.2 C)   Filed Weights   08/20/20 1012  Weight: 128 lb 3.2 oz (58.2 kg)    Physical Exam Constitutional:      General: He is not in acute distress.    Comments: Thin, walk independantly  HENT:     Head: Normocephalic and atraumatic.  Eyes:     General: No scleral icterus.    Pupils: Pupils are equal, round, and reactive to light.  Cardiovascular:     Rate and Rhythm: Normal rate and regular rhythm.  Heart sounds: Normal heart sounds.  Pulmonary:     Effort: Pulmonary effort is normal. No respiratory distress.     Breath sounds: No wheezing.  Abdominal:     General: Bowel sounds are normal. There is no distension.     Palpations: Abdomen is soft. There is no mass.     Tenderness: There is no abdominal tenderness.  Musculoskeletal:        General: No deformity. Normal range of motion.     Cervical back: Normal range of motion and neck supple.  Skin:    General: Skin is warm and dry.     Findings: No erythema or rash.  Neurological:     Mental Status: He is alert and oriented to person, place, and time. Mental status is at baseline.     Cranial Nerves: No cranial nerve deficit.     Coordination: Coordination normal.  Psychiatric:        Mood and Affect: Mood normal.      LABORATORY DATA:  I have reviewed the data as listed Lab Results  Component Value Date   WBC 3.2 (L) 08/20/2020   HGB 12.2 (L) 08/20/2020   HCT 36.9 (L) 08/20/2020   MCV 88.3 08/20/2020   PLT 137 (L) 08/20/2020    Recent Labs    09/06/19 1059 09/06/19 1059 12/18/19 1335 06/20/20 0933 08/20/20 0941  NA 141   < > 139 139 139  K 4.6   < > 4.5 4.1 4.2  CL 107   < > 106 105 104  CO2 27   < > _0 GLUCOSE 119*   < > 90 90 106*  BUN 25*   < > 29* 23 29*  CREATININE 1.00   < > 1.17 1.16 1.21  CALCIUM 9.0   < > 9.1 8.8* 8.7*  GFRNONAA >60   < > 59* 59* 56*  GFRAA >60  --  >60 >60  --   PROT 7.3   < > 7.0 6.9 7.4  ALBUMIN 3.5   < > 3.6 3.7 3.7  AST 28   < > 24 31 46*  ALT 25   < > _1 ALKPHOS 60   < > 61 64 56  BILITOT 0.7   < > 0.5 0.9 1.1   < > = values in this interval not displayed.   Iron/TIBC/Ferritin/ %Sat    Component Value Date/Time   IRON 49 09/06/2019 1059   TIBC 265 09/06/2019 1059   FERRITIN 249 09/06/2019 1059   IRONPCTSAT 19 09/06/2019 1059      RADIOGRAPHIC STUDIES: I have personally reviewed the radiological images as listed and agreed with the findings in the report.  No results found.   ASSESSMENT & PLAN:  1. Thrombocytopenia ([place])   2. Other pancytopenia ([place])   3. Normocytic anemia   4. Leukopenia, unspecified type    Chronic pancytopenia, previous peripheral blood work-up was not remarkable. Labs reviewed and discussed with patient. Leukopenia, thrombocytopenia, anemia slightly worse on unchanged comparing to 83-monthago.  No etiology was found from initial peripheral blood work-up. He continues to have unintentional weight loss. Discussed with patient again about bone marrow biopsy for further evaluation and he agrees this time.  Will arrange. Check check x-ray and ultrasound abdomen complete as the first step of unintentional weight loss.  Additional images may be indicated if no etiologies are determined for unintentional weight loss.  He agrees with the plan.   Orders Placed  This Encounter  Procedures  . DG Chest 2 View    Standing Status:   Future    Number of Occurrences:   1    Standing Expiration Date:   08/20/2021    Order Specific  Question:   Reason for Exam (SYMPTOM  OR DIAGNOSIS REQUIRED)    Answer:   unintentional weight loss    Order Specific Question:   Preferred imaging location?    Answer:   [facility]  . US Abdomen Complete    Standing Status:   Future    Standing Expiration Date:   08/20/2021    Order Specific Question:   Reason for Exam (SYMPTOM  OR DIAGNOSIS REQUIRED)    Answer:   pancytopenia, unintentional weight loss    Order Specific Question:   Preferred imaging location?    Answer:   [facility]  . CT BONE MARROW BIOPSY & ASPIRATION    Standing Status:   Future    Standing Expiration Date:   08/20/2021    Order Specific Question:   Reason for Exam (SYMPTOM  OR DIAGNOSIS REQUIRED)    Answer:   pancytopenia, unintentional weight loss    Order Specific Question:   Preferred location?    Answer:   [facility]    Order Specific Question:   Radiology Contrast Protocol - do NOT remove file path    Answer:   [email or web address]    All questions were answered. The patient knows to call the clinic with any problems questions or concerns.  Return of visit: 2 weeks after bone marrow biopsy.  Earlie Server, MD, PhD 08/20/2020

## 2020-08-26 NOTE — Progress Notes (Signed)
Patient on schedule for BMB 08/29/2020, called and spoke with wife on phone with pre procedure instructions given. Made aware to be here @ 0730, NPO after Mn, as well as  Have driver post procedure/recovery/discharge. Stated understanding.

## 2020-08-27 ENCOUNTER — Other Ambulatory Visit: Payer: Self-pay

## 2020-08-27 ENCOUNTER — Ambulatory Visit
Admission: RE | Admit: 2020-08-27 | Discharge: 2020-08-27 | Disposition: A | Discharge status: Home / Self Care | Payer: Medicare HMO | Source: Ambulatory Visit | Attending: Oncology | Admitting: Oncology

## 2020-08-27 DIAGNOSIS — D61818 Other pancytopenia: Secondary | ICD-10-CM | POA: Diagnosis not present

## 2020-08-28 ENCOUNTER — Telehealth: Payer: Self-pay | Admitting: *Deleted

## 2020-08-28 ENCOUNTER — Other Ambulatory Visit: Payer: Self-pay | Admitting: Student

## 2020-08-28 DIAGNOSIS — K769 Liver disease, unspecified: Secondary | ICD-10-CM

## 2020-08-28 DIAGNOSIS — D696 Thrombocytopenia, unspecified: Secondary | ICD-10-CM

## 2020-08-28 NOTE — Telephone Encounter (Signed)
Please let him know that the abdomen US result showed liver lesions, I recommend to further work up with MRI hepatic protocol ( I think that is MRI abdomen w /wo , but please confirm)

## 2020-08-28 NOTE — Telephone Encounter (Signed)
Called report  IMPRESSION: 1. Multiple hypoechoic hepatic lesions. These are incompletely characterized by ultrasound. 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 Recommend further characterization with hepatic protocol MRI. 2. Mild gallbladder distention but no evidence of biliary dilatation or obstruction. 3. Bilateral renal cysts.  These results will be called to the ordering clinician or representative by the Radiologist Assistant, and communication documented in the PACS or [employer].   Electronically Signed   By: Keith Rake M.D.   On: 08/28/2020 16:24

## 2020-08-29 ENCOUNTER — Other Ambulatory Visit: Payer: Self-pay

## 2020-08-29 ENCOUNTER — Ambulatory Visit
Admission: RE | Admit: 2020-08-29 | Discharge: 2020-08-29 | Disposition: A | Discharge status: Home / Self Care | Payer: Medicare HMO | Source: Ambulatory Visit | Attending: Oncology | Admitting: Oncology

## 2020-08-29 DIAGNOSIS — I252 Old myocardial infarction: Secondary | ICD-10-CM | POA: Insufficient documentation

## 2020-08-29 DIAGNOSIS — Z7982 Long term (current) use of aspirin: Secondary | ICD-10-CM | POA: Insufficient documentation

## 2020-08-29 DIAGNOSIS — Z888 Allergy status to other drugs, medicaments and biological substances status: Secondary | ICD-10-CM | POA: Diagnosis not present

## 2020-08-29 DIAGNOSIS — E785 Hyperlipidemia, unspecified: Secondary | ICD-10-CM | POA: Insufficient documentation

## 2020-08-29 DIAGNOSIS — Z7901 Long term (current) use of anticoagulants: Secondary | ICD-10-CM | POA: Diagnosis not present

## 2020-08-29 DIAGNOSIS — I10 Essential (primary) hypertension: Secondary | ICD-10-CM | POA: Diagnosis not present

## 2020-08-29 DIAGNOSIS — Z79899 Other long term (current) drug therapy: Secondary | ICD-10-CM | POA: Insufficient documentation

## 2020-08-29 DIAGNOSIS — Z955 Presence of coronary angioplasty implant and graft: Secondary | ICD-10-CM | POA: Insufficient documentation

## 2020-08-29 DIAGNOSIS — I429 Cardiomyopathy, unspecified: Secondary | ICD-10-CM | POA: Insufficient documentation

## 2020-08-29 DIAGNOSIS — D61818 Other pancytopenia: Secondary | ICD-10-CM | POA: Diagnosis present

## 2020-08-29 LAB — CBC WITH DIFFERENTIAL/PLATELET
Abs Immature Granulocytes: 0.01 10*3/uL (ref 0.00–0.07)
Basophils Absolute: 0.1 10*3/uL (ref 0.0–0.1)
Basophils Relative: 1 %
Eosinophils Absolute: 0.2 10*3/uL (ref 0.0–0.5)
Eosinophils Relative: 4 %
HCT: 35.1 % — ABNORMAL LOW (ref 39.0–52.0)
Hemoglobin: 11.3 g/dL — ABNORMAL LOW (ref 13.0–17.0)
Immature Granulocytes: 0 %
Lymphocytes Relative: 32 %
Lymphs Abs: 1.3 10*3/uL (ref 0.7–4.0)
MCH: 28.8 pg (ref 26.0–34.0)
MCHC: 32.2 g/dL (ref 30.0–36.0)
MCV: 89.5 fL (ref 80.0–100.0)
Monocytes Absolute: 0.6 10*3/uL (ref 0.1–1.0)
Monocytes Relative: 15 %
Neutro Abs: 1.9 10*3/uL (ref 1.7–7.7)
Neutrophils Relative %: 48 %
Platelets: 167 10*3/uL (ref 150–400)
RBC: 3.92 MIL/uL — ABNORMAL LOW (ref 4.22–5.81)
RDW: 13.7 % (ref 11.5–15.5)
WBC: 4 10*3/uL (ref 4.0–10.5)
nRBC: 0 % (ref 0.0–0.2)

## 2020-08-29 MED ORDER — FENTANYL CITRATE (PF) 100 MCG/2ML IJ SOLN
INTRAMUSCULAR | Status: AC | PRN
  Administered 2020-08-29: 25 ug via INTRAVENOUS

## 2020-08-29 MED ORDER — HEPARIN SOD (PORK) LOCK FLUSH 100 UNIT/ML IV SOLN
INTRAVENOUS | Status: AC
  Filled 2020-08-29: qty 5

## 2020-08-29 MED ORDER — FENTANYL CITRATE (PF) 100 MCG/2ML IJ SOLN
INTRAMUSCULAR | Status: AC
  Filled 2020-08-29: qty 2

## 2020-08-29 MED ORDER — MIDAZOLAM HCL 2 MG/2ML IJ SOLN
INTRAMUSCULAR | Status: AC
  Filled 2020-08-29: qty 2

## 2020-08-29 MED ORDER — MIDAZOLAM HCL 2 MG/2ML IJ SOLN
INTRAMUSCULAR | Status: AC | PRN
  Administered 2020-08-29: 0.5 mg via INTRAVENOUS

## 2020-08-29 MED ORDER — SODIUM CHLORIDE 0.9 % IV SOLN: INTRAVENOUS | Status: DC

## 2020-08-29 NOTE — Discharge Instructions (Signed)
Bone Marrow Aspiration and Bone Marrow Biopsy, Adult, Care After This sheet gives you information about how to care for yourself after your procedure. Your health care provider may also give you more specific instructions. If you have problems or questions, contact your health care provider. What can I expect after the procedure? After the procedure, it is common to have:  Mild pain and tenderness.  Swelling.  Bruising. Follow these instructions at home: Puncture site care   Follow instructions from your health care provider about how to take care of the puncture site. Make sure you: ? Wash your hands with soap and water before and after you change your bandage (dressing). If soap and water are not available, use hand sanitizer. ? Change your dressing as told by your health care provider.  Check your puncture site every day for signs of infection. Check for: ? More redness, swelling, or pain. ? Fluid or blood. ? Warmth. ? Pus or a bad smell. Activity  Return to your normal activities as told by your health care provider. Ask your health care provider what activities are safe for you.  Do not lift anything that is heavier than 10 lb (4.5 kg), or the limit that you are told, until your health care provider says that it is safe.  Do not drive for 24 hours if you were given a sedative during your procedure. General instructions   Take over-the-counter and prescription medicines only as told by your health care provider.  Do not take baths, swim, or use a hot tub until your health care provider approves. Ask your health care provider if you may take showers. You may only be allowed to take sponge baths.  If directed, put ice on the affected area. To do this: ? Put ice in a plastic bag. ? Place a towel between your skin and the bag. ? Leave the ice on for 20 minutes, 2-3 times a day.  Keep all follow-up visits as told by your health care provider. This is important. Contact a  health care provider if:  Your pain is not controlled with medicine.  You have a fever.  You have more redness, swelling, or pain around the puncture site.  You have fluid or blood coming from the puncture site.  Your puncture site feels warm to the touch.  You have pus or a bad smell coming from the puncture site. Summary  After the procedure, it is common to have mild pain, tenderness, swelling, and bruising.  Follow instructions from your health care provider about how to take care of the puncture site and what activities are safe for you.  Take over-the-counter and prescription medicines only as told by your health care provider.  Contact a health care provider if you have any signs of infection, such as fluid or blood coming from the puncture site. This information is not intended to replace advice given to you by your health care provider. Make sure you discuss any questions you have with your health care provider. Document Revised: 03/21/2019 Document Reviewed: 03/21/2019 Elsevier Patient Education  2020 [employer]  

## 2020-08-29 NOTE — Telephone Encounter (Signed)
Confirmed with MRI dept that order is MRI abdomen w /wo and put liver lesions in comment section for reason.  Please schedule MRI and I will inform patient of appt when I call him with results of [place].

## 2020-08-29 NOTE — Addendum Note (Signed)
Addended by: Vanice Sarah on: 08/29/2020 09:04 AM   Modules accepted: Orders

## 2020-08-29 NOTE — Telephone Encounter (Signed)
Done...  MRI has been schedule as requested The 1st avail date they have is 09/13/20 [facility] @ 9am  arrive 8:30am/npo 4hrs

## 2020-08-29 NOTE — Procedures (Signed)
Interventional Radiology Procedure Note  Procedure: CT guided aspirate and core biopsy of right iliac bone  Complications: None  Recommendations: - Bedrest supine x 1 hrs - Hydrocodone PRN  Pain - Follow biopsy results   Rinaldo Macqueen, MD Pager: [phone]    

## 2020-08-29 NOTE — Telephone Encounter (Signed)
Patient's wife notified of [place] results and MRI appt.

## 2020-08-29 NOTE — Progress Notes (Signed)
Patient clinically stable post BMB per Dr Serafina Royals, tolerated well. Vitals stable pre and post procedure. Received Versed 0.5 mg along with Fentanyl 25 mcg IV for procedure. Denies complaints post procedure. Report given to Carlynn Spry Rn post procedure.

## 2020-08-29 NOTE — H&P (Signed)
Chief Complaint: Patient was seen in consultation today for bone marrow biopsy at the request of Yu,Zhou  Referring Physician(s): Yu,Zhou  Patient Status: [facility] - Out-pt  History of Present Illness: Samuel Mcgee is a 79 y.o. male with history of pancytopenia presenting for bone marrow biopsy.  He feels well today with no complaints specifically no fevers, chills, shortness of breath, chest pain, abdominal pain, nausea, vomiting.  Past Medical History:  Diagnosis Date  . Arthritis   . Cardiomyopathy ([place])   . Heart disease   . Hyperlipidemia   . Hypertension   . Inguinal hernia   . Myocardial infarction [facility])     Past Surgical History:  Procedure Laterality Date  . COLONOSCOPY WITH PROPOFOL N/A 12/15/2016   Procedure: COLONOSCOPY WITH PROPOFOL;  Surgeon: Lollie Sails, MD;  Location: [facility] ENDOSCOPY;  Service: Endoscopy;  Laterality: N/A;  . CORONARY ANGIOPLASTY WITH STENT PLACEMENT    . HERNIA REPAIR Left 2003?  Marland Kitchen HERNIA REPAIR Bilateral 04-03-14   inguinal  . INGUINAL HERNIA REPAIR Bilateral 04-03-14  . LIPOMA EXCISION Right 2008   right posterior neck    Allergies: Lisinopril  Medications: Prior to Admission medications   Medication Sig Start Date End Date Taking? Authorizing Provider  aspirin 81 MG tablet Take 81 mg by mouth daily.   Yes [provider]  atorvastatin (LIPITOR) 80 MG tablet Take 80 mg by mouth daily.   Yes [provider]  brimonidine (ALPHAGAN) 0.2 % ophthalmic solution INSTILL ONE DROP IN [facility] EYE BID. 07/07/16  Yes [provider]  carvedilol (COREG) 6.25 MG tablet Take 6.25 mg by mouth 2 (two) times daily with a meal.   Yes [provider]  clopidogrel (PLAVIX) 75 MG tablet Take 75 mg by mouth daily with breakfast.   Yes [provider]  fluticasone (FLONASE) 50 MCG/ACT nasal spray  07/04/19  Yes [provider]  latanoprost (XALATAN) 0.005 % ophthalmic solution 1 drop at bedtime.    Yes [provider]  polyethylene glycol powder (GLYCOLAX/MIRALAX) 17 GM/SCOOP powder Take as directed for colonic prep. 12/10/16  Yes [provider]  triamcinolone (KENALOG) 0.025 % cream Apply 1 application topically 2 (two) times daily.   Yes [provider]     No family history on file.  Social History   Socioeconomic History  . Marital status: Married    Spouse name: Not on file  . Number of children: Not on file  . Years of education: Not on file  . Highest education level: Not on file  Occupational History  . Not on file  Tobacco Use  . Smoking status: Never Smoker  . Smokeless tobacco: Never Used  Substance and Sexual Activity  . Alcohol use: No  . Drug use: No  . Sexual activity: Not on file  Other Topics Concern  . Not on file  Social History Narrative  . Not on file   Social Determinants of Health   Financial Resource Strain:   . Difficulty of Paying Living Expenses: Not on file  Food Insecurity:   . Worried About Charity fundraiser in the Last Year: Not on file  . Ran Out of Food in the Last Year: Not on file  Transportation Needs:   . Lack of Transportation (Medical): Not on file  . Lack of Transportation (Non-Medical): Not on file  Physical Activity:   . Days of Exercise per Week: Not on file  . Minutes of Exercise per Session: Not on  file  Stress:   . Feeling of Stress : Not on file  Social Connections:   . Frequency of Communication with Friends and Family: Not on file  . Frequency of Social Gatherings with Friends and Family: Not on file  . Attends Religious Services: Not on file  . Active Member of Clubs or Organizations: Not on file  . Attends Archivist Meetings: Not on file  . Marital Status: Not on file    Review of Systems: A 12 point ROS discussed and pertinent positives are indicated in the HPI above.  All other systems are negative. Vital Signs: BP 117/72   Pulse (!) 45   Temp 98.2 F (36.8 C)  (Oral)   Resp 20   Ht _0  (1.803 m)   Wt 58.5 kg   SpO2 99%   BMI 17.99 kg/m   Physical Exam Constitutional:      General: He is not in acute distress. HENT:     Head: Normocephalic.     Mouth/Throat:     Mouth: Mucous membranes are moist.     Comments: MP2  Cardiovascular:     Rate and Rhythm: Normal rate and regular rhythm.  Pulmonary:     Effort: Pulmonary effort is normal.     Breath sounds: Normal breath sounds.  Abdominal:     General: There is no distension.  Musculoskeletal:        General: No swelling.  Skin:    General: Skin is warm and dry.  Neurological:     Mental Status: He is alert and oriented to person, place, and time.     Imaging: DG Chest 2 View  Result Date: 08/20/2020 CLINICAL DATA:  79 year old male with unintentional weight loss. EXAM: CHEST - 2 VIEW COMPARISON:  Chest radiographs 04/07/2011 and earlier. FINDINGS: Lung volumes and mediastinal contours are stable since 2012, with chronic tortuosity of the aorta and heart size at the upper limits of normal. No pneumothorax, pulmonary edema, pleural effusion or confluent pulmonary opacity. Visualized tracheal air column is within normal limits. No acute osseous abnormality identified. Negative visible bowel gas pattern. IMPRESSION: 1.  No acute cardiopulmonary abnormality. 2. Chronically tortuous thoracic aorta with borderline to mild cardiomegaly. Electronically Signed   By: Genevie Ann M.D.   On: 08/20/2020 17:28   US Abdomen Complete  Result Date: 08/28/2020 CLINICAL DATA:  Pancytopenia.  Unintentional weight loss. EXAM: ABDOMEN ULTRASOUND COMPLETE COMPARISON:  None. FINDINGS: Gallbladder: Distended. No gallstones or wall thickening visualized. No sonographic Murphy sign noted by sonographer. Common bile duct: Diameter: 4 mm, normal. Liver: Background increased hepatic echogenicity. There are multiple hypodense of coag lesions throughout the right and left lobe of the liver, largest measuring 4.2 x 3.5  x 3.1 cm. Portal vein is patent on color Doppler imaging with normal direction of blood flow towards the liver. IVC: No abnormality visualized. Pancreas: Not well visualized sonographically. Spleen: Size and appearance within normal limits.  No splenomegaly. Right Kidney: Length: 10.0 cm. Echogenicity within normal limits. No hydronephrosis. There are multiple simple cysts, largest measuring 7.7 x 6.0 x 7.2 cm. No evidence of solid lesion. Left Kidney: Length: 10.0 cm. Echogenicity within normal limits. No hydronephrosis. Cyst in the lower pole measuring 1.1 cm, possible internal septation. No evidence of solid lesion. Abdominal aorta: No aneurysm visualized. Other findings: No ascites. IMPRESSION: 1. Multiple hypoechoic hepatic lesions. These are incompletely characterized by ultrasound. 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 Recommend further characterization with hepatic protocol MRI. 2. Mild  gallbladder distention but no evidence of biliary dilatation or obstruction. 3. Bilateral renal cysts. These results will be called to the ordering clinician or representative by the Radiologist Assistant, and communication documented in the PACS or [employer]. Electronically Signed   By: Keith Rake M.D.   On: 08/28/2020 16:24    Labs:  CBC: Recent Labs    12/18/19 1335 06/20/20 0933 08/20/20 0941 08/29/20 0747  WBC 5.0 3.4* 3.2* 4.0  HGB 12.3* 12.7* 12.2* 11.3*  HCT 39.5 37.8* 36.9* 35.1*  PLT 141* 121* 137* 167    COAGS: No results for input(s): INR, APTT in the last 8760 hours.  BMP: Recent Labs    09/06/19 1059 12/18/19 1335 06/20/20 0933 08/20/20 0941  NA 141 139 139 139  K 4.6 4.5 4.1 4.2  CL 107 106 105 104  CO2 _0 GLUCOSE 119* 90 90 106*  BUN 25* 29* 23 29*  CALCIUM 9.0 9.1 8.8* 8.7*  CREATININE 1.00 1.17 1.16 1.21  GFRNONAA >60 59* 59* 56*  GFRAA >60 >60 >60  --     LIVER FUNCTION TESTS: Recent Labs    09/06/19 1059 12/18/19 1335  06/20/20 0933 08/20/20 0941  BILITOT 0.7 0.5 0.9 1.1  AST _1 46*  ALT _2 ALKPHOS 60 61 64 56  PROT 7.3 7.0 6.9 7.4  ALBUMIN 3.5 3.6 3.7 3.7    TUMOR MARKERS: No results for input(s): AFPTM, CEA, CA199, CHROMGRNA in the last 8760 hours.  Assessment and Plan:  79 year old male with history of pancytopenia of uncertain etiology.  Plan for CT guided bone marrow biopsy with moderate sedation.  Thank you for this interesting consult.  I greatly enjoyed meeting Samuel Mcgee and look forward to participating in their care.  A copy of this report was sent to the requesting provider on this date.  Electronically Signed: Suzette Battiest, MD 08/29/2020, 8:17 AM   I spent a total of15 Minutes in face to face in clinical consultation, greater than 50% of which was counseling/coordinating care for bone marrow biopsy.

## 2020-09-02 LAB — SURGICAL PATHOLOGY

## 2020-09-10 ENCOUNTER — Encounter (HOSPITAL_COMMUNITY): Payer: Self-pay | Admitting: Oncology

## 2020-09-10 ENCOUNTER — Telehealth: Payer: Self-pay

## 2020-09-10 NOTE — Telephone Encounter (Signed)
Please change appt as requested by MD and call pt with appt changes. Thanks

## 2020-09-10 NOTE — Telephone Encounter (Signed)
Done Pt 10/28 MD appt has been R/S to 09/16/20 Called and made pts wife Mechele Claude aware of his NEW sched appt

## 2020-09-10 NOTE — Telephone Encounter (Signed)
-----   Message from Earlie Server, MD sent at 09/09/2020 10:52 PM EDT ----- Please schedule his follow up to be after his MRI, so I can discuss results. Thanks.

## 2020-09-12 ENCOUNTER — Inpatient Hospital Stay: Payer: Medicare HMO | Admitting: Oncology

## 2020-09-13 ENCOUNTER — Other Ambulatory Visit: Payer: Self-pay

## 2020-09-13 ENCOUNTER — Ambulatory Visit
Admission: RE | Admit: 2020-09-13 | Discharge: 2020-09-13 | Disposition: A | Discharge status: Home / Self Care | Payer: Medicare HMO | Source: Ambulatory Visit | Attending: Oncology | Admitting: Oncology

## 2020-09-13 DIAGNOSIS — D696 Thrombocytopenia, unspecified: Secondary | ICD-10-CM | POA: Insufficient documentation

## 2020-09-13 DIAGNOSIS — K769 Liver disease, unspecified: Secondary | ICD-10-CM | POA: Insufficient documentation

## 2020-09-13 MED ORDER — GADOBUTROL 1 MMOL/ML IV SOLN
5.0000 mL | Freq: Once | INTRAVENOUS | Status: AC | PRN
  Administered 2020-09-13: 5 mL via INTRAVENOUS

## 2020-09-16 ENCOUNTER — Other Ambulatory Visit: Payer: Self-pay

## 2020-09-16 ENCOUNTER — Inpatient Hospital Stay: Payer: Medicare HMO

## 2020-09-16 ENCOUNTER — Encounter: Payer: Self-pay | Admitting: Oncology

## 2020-09-16 ENCOUNTER — Inpatient Hospital Stay: Payer: Medicare HMO | Attending: Oncology | Admitting: Oncology

## 2020-09-16 VITALS — BP 129/78 | HR 48 | Temp 96.7°F | Resp 18 | Wt 132.4 lb

## 2020-09-16 DIAGNOSIS — K769 Liver disease, unspecified: Secondary | ICD-10-CM

## 2020-09-16 DIAGNOSIS — Z888 Allergy status to other drugs, medicaments and biological substances status: Secondary | ICD-10-CM | POA: Insufficient documentation

## 2020-09-16 DIAGNOSIS — D72819 Decreased white blood cell count, unspecified: Secondary | ICD-10-CM | POA: Insufficient documentation

## 2020-09-16 DIAGNOSIS — N281 Cyst of kidney, acquired: Secondary | ICD-10-CM | POA: Insufficient documentation

## 2020-09-16 DIAGNOSIS — R634 Abnormal weight loss: Secondary | ICD-10-CM | POA: Diagnosis not present

## 2020-09-16 DIAGNOSIS — K648 Other hemorrhoids: Secondary | ICD-10-CM | POA: Insufficient documentation

## 2020-09-16 DIAGNOSIS — D696 Thrombocytopenia, unspecified: Secondary | ICD-10-CM

## 2020-09-16 DIAGNOSIS — K828 Other specified diseases of gallbladder: Secondary | ICD-10-CM | POA: Insufficient documentation

## 2020-09-16 DIAGNOSIS — Z79899 Other long term (current) drug therapy: Secondary | ICD-10-CM | POA: Diagnosis not present

## 2020-09-16 DIAGNOSIS — D61818 Other pancytopenia: Secondary | ICD-10-CM | POA: Diagnosis not present

## 2020-09-16 DIAGNOSIS — I429 Cardiomyopathy, unspecified: Secondary | ICD-10-CM | POA: Insufficient documentation

## 2020-09-16 DIAGNOSIS — N400 Enlarged prostate without lower urinary tract symptoms: Secondary | ICD-10-CM | POA: Diagnosis not present

## 2020-09-16 DIAGNOSIS — D1809 Hemangioma of other sites: Secondary | ICD-10-CM | POA: Diagnosis not present

## 2020-09-16 DIAGNOSIS — I252 Old myocardial infarction: Secondary | ICD-10-CM | POA: Insufficient documentation

## 2020-09-16 DIAGNOSIS — R932 Abnormal findings on diagnostic imaging of liver and biliary tract: Secondary | ICD-10-CM | POA: Diagnosis not present

## 2020-09-16 DIAGNOSIS — D649 Anemia, unspecified: Secondary | ICD-10-CM | POA: Diagnosis not present

## 2020-09-16 NOTE — Progress Notes (Signed)
Pt here for follow up. No new concerns voiced.   

## 2020-09-16 NOTE — Progress Notes (Signed)
Hematology/Oncology follow up note [facility] [phone] [phone]   Patient Care Team: Tomasita Morrow, MD as PCP - General (Family Medicine) Tomasita Morrow, MD (Family Medicine) Christene Lye, MD (General Surgery)  REFERRING PROVIDER: Tomasita Morrow, MD  CHIEF COMPLAINTS/REASON FOR VISIT:  Follow-up for anemia, leukopenia and thrombocytopenia  HISTORY OF PRESENTING ILLNESS:  Samuel Mcgee is a 79 y.o. male who was seen in consultation at the request of Tomasita Morrow, MD for evaluation of anemia, leukopenia, thrombocytopenia/pancytopenia  Reviewed patient's labs done previously.  There was no recent blood work done in current  EMR. Blood work from primary care provider's office, was scanned in epic. 08/12/2019 Labs showed WBC 3.2, hemoglobin 12.4, hematocrit 89, decreased platelet counts at 10 4000. CMP showed BUN 30, creatinine 1.16, calcium 9.4, potassium 4.7, sodium 143, albumin 4.1, Reviewing patient's past blood work done in 2015.  His blood count were completely normal in March 2015. Unknown onset of pancytopenia.  Per patient he gets blood work done every 6 months with primary care provider and he has been told that his counts are low for about a year.  -Colonoscopy 12/15/2016 showed small polyps in the cecum.  Removed and retrieved.  Nonbleeding internal hemorrhoids.  Enlarged prostate found on digital rectal exam.  Pathology showed villous adenoma Lives at home with wife.  INTERVAL HISTORY Samuel Mcgee is a 79 y.o. male who has above history reviewed by me today presents for follow up visit for management of pancytopenia.  Unintentional weight loss Problems and complaints are listed below: Patient reports having good appetite.  His weight has been stable and he has gained some weight back. Denies any constitutional symptoms. During the interval, an ultrasound showed liver lesions which was further worked up with  an MRI abdomen. He also has had bone marrow biopsy for further evaluation and he presents to discuss results   Review of Systems  Constitutional: Negative for appetite change, chills, fatigue, fever and unexpected weight change.  HENT:   Negative for hearing loss and voice change.   Eyes: Negative for eye problems and icterus.  Respiratory: Negative for chest tightness, cough and shortness of breath.   Cardiovascular: Negative for chest pain and leg swelling.  Gastrointestinal: Negative for abdominal distention and abdominal pain.  Endocrine: Negative for hot flashes.  Genitourinary: Negative for difficulty urinating, dysuria and frequency.   Musculoskeletal: Negative for arthralgias.  Skin: Negative for itching and rash.  Neurological: Negative for light-headedness and numbness.  Hematological: Negative for adenopathy. Does not bruise/bleed easily.  Psychiatric/Behavioral: Negative for confusion.    MEDICAL HISTORY:  Past Medical History:  Diagnosis Date  . Arthritis   . Cardiomyopathy ([place])   . Heart disease   . Hyperlipidemia   . Hypertension   . Inguinal hernia   . Myocardial infarction [facility])     SURGICAL HISTORY: Past Surgical History:  Procedure Laterality Date  . COLONOSCOPY WITH PROPOFOL N/A 12/15/2016   Procedure: COLONOSCOPY WITH PROPOFOL;  Surgeon: Lollie Sails, MD;  Location[facility] ENDOSCOPY;  Service: Endoscopy;  Laterality: N/A;  . CORONARY ANGIOPLASTY WITH STENT PLACEMENT    . HERNIA REPAIR Left 2003?  Marland Kitchen HERNIA REPAIR Bilateral 04-03-14   inguinal  . INGUINAL HERNIA REPAIR Bilateral 04-03-14  . LIPOMA EXCISION Right 2008   right posterior neck    SOCIAL HISTORY: Social History   Socioeconomic History  . Marital status: Married    Spouse name: Not on file  . Number of children: Not  on file  . Years of education: Not on file  . Highest education level: Not on file  Occupational History  . Not on file  Tobacco Use  . Smoking status: Never Smoker    . Smokeless tobacco: Never Used  Substance and Sexual Activity  . Alcohol use: No  . Drug use: No  . Sexual activity: Not on file  Other Topics Concern  . Not on file  Social History Narrative  . Not on file   Social Determinants of Health   Financial Resource Strain:   . Difficulty of Paying Living Expenses: Not on file  Food Insecurity:   . Worried About Charity fundraiser in the Last Year: Not on file  . Ran Out of Food in the Last Year: Not on file  Transportation Needs:   . Lack of Transportation (Medical): Not on file  . Lack of Transportation (Non-Medical): Not on file  Physical Activity:   . Days of Exercise per Week: Not on file  . Minutes of Exercise per Session: Not on file  Stress:   . Feeling of Stress : Not on file  Social Connections:   . Frequency of Communication with Friends and Family: Not on file  . Frequency of Social Gatherings with Friends and Family: Not on file  . Attends Religious Services: Not on file  . Active Member of Clubs or Organizations: Not on file  . Attends Archivist Meetings: Not on file  . Marital Status: Not on file  Intimate Partner Violence:   . Fear of Current or Ex-Partner: Not on file  . Emotionally Abused: Not on file  . Physically Abused: Not on file  . Sexually Abused: Not on file    FAMILY HISTORY: History reviewed. No pertinent family history. Denies any family history. ALLERGIES:  is allergic to lisinopril.  MEDICATIONS:  Current Outpatient Medications  Medication Sig Dispense Refill  . aspirin 81 MG tablet Take 81 mg by mouth daily.    Marland Kitchen atorvastatin (LIPITOR) 80 MG tablet Take 80 mg by mouth daily.    . brimonidine (ALPHAGAN) 0.2 % ophthalmic solution INSTILL ONE DROP IN EACH EYE BID.    Marland Kitchen carvedilol (COREG) 6.25 MG tablet Take 6.25 mg by mouth 2 (two) times daily with a meal.    . clopidogrel (PLAVIX) 75 MG tablet Take 75 mg by mouth daily with breakfast.    . fluticasone (FLONASE) 50 MCG/ACT nasal  spray     . latanoprost (XALATAN) 0.005 % ophthalmic solution 1 drop at bedtime.    . polyethylene glycol powder (GLYCOLAX/MIRALAX) 17 GM/SCOOP powder Take as directed for colonic prep.    . triamcinolone (KENALOG) 0.025 % cream Apply 1 application topically 2 (two) times daily.    . nitroGLYCERIN (NITROSTAT) 0.4 MG SL tablet  (Patient not taking: Reported on 09/16/2020)     No current facility-administered medications for this visit.     PHYSICAL EXAMINATION: ECOG PERFORMANCE STATUS: 1 - Symptomatic but completely ambulatory Vitals:   09/16/20 0924  BP: 129/78  Pulse: (!) 48  Resp: 18  Temp: (!) 96.7 F (35.9 C)  SpO2: 98%   Filed Weights   09/16/20 0924  Weight: 132 lb 6.4 oz (60.1 kg)    Physical Exam Constitutional:      General: He is not in acute distress.    Comments: Thin, walk independantly  HENT:     Head: Normocephalic and atraumatic.  Eyes:     General: No scleral icterus.  Pupils: Pupils are equal, round, and reactive to light.  Cardiovascular:     Rate and Rhythm: Normal rate and regular rhythm.     Heart sounds: Normal heart sounds.  Pulmonary:     Effort: Pulmonary effort is normal. No respiratory distress.     Breath sounds: No wheezing.  Abdominal:     General: Bowel sounds are normal. There is no distension.     Palpations: Abdomen is soft. There is no mass.     Tenderness: There is no abdominal tenderness.  Musculoskeletal:        General: No deformity. Normal range of motion.     Cervical back: Normal range of motion and neck supple.  Skin:    General: Skin is warm and dry.     Findings: No erythema or rash.  Neurological:     Mental Status: He is alert and oriented to person, place, and time. Mental status is at baseline.     Cranial Nerves: No cranial nerve deficit.     Coordination: Coordination normal.  Psychiatric:        Mood and Affect: Mood normal.      LABORATORY DATA:  I have reviewed the data as listed Lab Results    Component Value Date   WBC 4.0 08/29/2020   HGB 11.3 (L) 08/29/2020   HCT 35.1 (L) 08/29/2020   MCV 89.5 08/29/2020   PLT 167 08/29/2020   Recent Labs    12/18/19 1335 06/20/20 0933 08/20/20 0941  NA 139 139 139  K 4.5 4.1 4.2  CL 106 105 104  CO2 _0 GLUCOSE 90 90 106*  BUN 29* 23 29*  CREATININE 1.17 1.16 1.21  CALCIUM 9.1 8.8* 8.7*  GFRNONAA 59* 59* 56*  GFRAA >60 >60  --   PROT 7.0 6.9 7.4  ALBUMIN 3.6 3.7 3.7  AST 24 31 46*  ALT _1 ALKPHOS 61 64 56  BILITOT 0.5 0.9 1.1   Iron/TIBC/Ferritin/ %Sat    Component Value Date/Time   IRON 49 09/06/2019 1059   TIBC 265 09/06/2019 1059   FERRITIN 249 09/06/2019 1059   IRONPCTSAT 19 09/06/2019 1059      RADIOGRAPHIC STUDIES: I have personally reviewed the radiological images as listed and agreed with the findings in the report.  DG Chest 2 View  Result Date: 08/20/2020 CLINICAL DATA:  79 year old male with unintentional weight loss. EXAM: CHEST - 2 VIEW COMPARISON:  Chest radiographs 04/07/2011 and earlier. FINDINGS: Lung volumes and mediastinal contours are stable since 2012, with chronic tortuosity of the aorta and heart size at the upper limits of normal. No pneumothorax, pulmonary edema, pleural effusion or confluent pulmonary opacity. Visualized tracheal air column is within normal limits. No acute osseous abnormality identified. Negative visible bowel gas pattern. IMPRESSION: 1.  No acute cardiopulmonary abnormality. 2. Chronically tortuous thoracic aorta with borderline to mild cardiomegaly. Electronically Signed   By: Genevie Ann M.D.   On: 08/20/2020 17:28   MR Abdomen W Wo Contrast  Result Date: 09/13/2020 CLINICAL DATA:  Characterize liver lesions, unintentional weight loss, pancytopenia EXAM: MRI ABDOMEN WITHOUT AND WITH CONTRAST TECHNIQUE: Multiplanar multisequence MR imaging of the abdomen was performed both before and after the administration of intravenous contrast. CONTRAST:  72m GADAVIST  GADOBUTROL 1 MMOL/ML IV SOLN COMPARISON:  Liver ultrasound, 08/27/2020 FINDINGS: Lower chest: No acute findings. Hepatobiliary: There are numerous, predominantly subcapsular, fluid signal lesions throughout the liver parenchyma, which demonstrate progressive discontinuous peripheral nodular enhancement on  multiphasic imaging. The largest lesion is in the anterior right lobe of the liver and measures 3.8 x 3.6 cm. No solid mass or other parenchymal abnormality identified. Pancreas: No mass, inflammatory changes, or other parenchymal abnormality identified. Spleen:  Within normal limits in size and appearance. Adrenals/Urinary Tract: No masses identified. Large exophytic cyst of the lateral midportion of the right kidney. Additional smaller cysts bilaterally. No evidence of hydronephrosis. Stomach/Bowel: Visualized portions within the abdomen are unremarkable. Vascular/Lymphatic: No pathologically enlarged lymph nodes identified. No abdominal aortic aneurysm demonstrated. Other:  None. Musculoskeletal: No suspicious bone lesions identified. IMPRESSION: There are numerous, predominantly subcapsular, fluid signal lesions throughout the liver parenchyma, which demonstrate progressive discontinuous peripheral nodular enhancement on multiphasic imaging. The largest lesion is in the anterior right lobe of the liver and measures 3.8 x 3.6 cm. 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 Electronically Signed   By: Eddie Candle M.D.   On: 09/13/2020 10:15   US Abdomen Complete  Result Date: 08/28/2020 CLINICAL DATA:  Pancytopenia.  Unintentional weight loss. EXAM: ABDOMEN ULTRASOUND COMPLETE COMPARISON:  None. FINDINGS: Gallbladder: Distended. No gallstones or wall thickening visualized. No sonographic Murphy sign noted by sonographer. Common bile duct: Diameter: 4 mm, normal. Liver: Background increased hepatic echogenicity. There are multiple hypodense of coag lesions throughout the right and left  lobe of the liver, largest measuring 4.2 x 3.5 x 3.1 cm. Portal vein is patent on color Doppler imaging with normal direction of blood flow towards the liver. IVC: No abnormality visualized. Pancreas: Not well visualized sonographically. Spleen: Size and appearance within normal limits.  No splenomegaly. Right Kidney: Length: 10.0 cm. Echogenicity within normal limits. No hydronephrosis. There are multiple simple cysts, largest measuring 7.7 x 6.0 x 7.2 cm. No evidence of solid lesion. Left Kidney: Length: 10.0 cm. Echogenicity within normal limits. No hydronephrosis. Cyst in the lower pole measuring 1.1 cm, possible internal septation. No evidence of solid lesion. Abdominal aorta: No aneurysm visualized. Other findings: No ascites. IMPRESSION: 1. Multiple hypoechoic hepatic lesions. These are incompletely characterized by ultrasound. 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 Recommend further characterization with hepatic protocol MRI. 2. Mild gallbladder distention but no evidence of biliary dilatation or obstruction. 3. Bilateral renal cysts. These results will be called to the ordering clinician or representative by the Radiologist Assistant, and communication documented in the PACS or [employer]. Electronically Signed   By: Keith Rake M.D.   On: 08/28/2020 16:24   CT BONE MARROW BIOPSY & ASPIRATION  Result Date: 08/29/2020 INDICATION: 80 year old male with history of pancytopenia. EXAM: CT-GUIDED BONE MARROW BIOPSY AND ASPIRATION MEDICATIONS: None ANESTHESIA/SEDATION: Fentanyl 25 mcg IV; Versed 0.5 mg IV Sedation Time: 10 minutes; The patient was continuously monitored during the procedure by the interventional radiology nurse under my direct supervision. COMPLICATIONS: None immediate. PROCEDURE: Informed consent was obtained from the patient following an explanation of the procedure, risks, benefits and alternatives. The patient understands, agrees and consents for the procedure. All  questions were addressed. A time out was performed prior to the initiation of the procedure. The patient was positioned prone and non-contrast localization CT was performed of the pelvis to demonstrate the iliac marrow spaces. The operative site was prepped and draped in the usual sterile fashion. Under sterile conditions and local anesthesia, a 22 gauge spinal needle was utilized for procedural planning. Next, an 11 gauge coaxial bone biopsy needle was advanced into the right iliac marrow space. Needle position was confirmed with CT imaging. Initially, a bone marrow  aspiration was performed. Next, a bone marrow biopsy was obtained with the 11 gauge outer bone marrow device. The 11 gauge coaxial bone biopsy needle was re-advanced into a slightly different location within the left iliac marrow space, positioning was confirmed with CT imaging and an additional bone marrow biopsy was obtained. Samples were prepared with the cytotechnologist and deemed adequate. The needle was removed and superficial hemostasis was obtained with manual compression. A dressing was applied. The patient tolerated the procedure well without immediate post procedural complication. IMPRESSION: Successful CT guided right iliac bone marrow aspiration and core biopsy. Ruthann Cancer, MD [facility] Electronically Signed   By: Ruthann Cancer MD   On: 08/29/2020 12:12     ASSESSMENT & PLAN:  1. Liver lesion   2. Thrombocytopenia ([place])   3. Normocytic anemia    Chronic pancytopenia, previous peripheral blood work-up was not remarkable. #Bone marrow biopsy results was reviewed with patient. Bone marrow biopsy showed trilineage hematopoiesis with plasmacytosis, the biopsy specimen is suboptimal with limited material.  Assessment of the accurate percentage is not possible nor is the light chain restriction assessment.  Cytogenetics showed loss of Y chromosome I discussed the results  with patient.  Recommend checking multiple myeloma panel and light chain ratio.  If the results are normal continue observation.  #Liver lesion, MRI image results were reviewed and discussed with patient. 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  Continue monitor liver function. Check AFP  Weight loss, weight seems to be stable recently.  Monitor.  Orders Placed This Encounter  Procedures  . Kappa/lambda light chains    Standing Status:   Future    Number of Occurrences:   1    Standing Expiration Date:   09/16/2021  . Multiple Myeloma Panel (SPEP&IFE w/QIG)    Standing Status:   Future    Number of Occurrences:   1    Standing Expiration Date:   09/16/2021  . AFP tumor marker    Standing Status:   Future    Number of Occurrences:   1    Standing Expiration Date:   09/16/2021    All questions were answered. The patient knows to call the clinic with any problems questions or concerns.  Return of visit: 4 months Earlie Server, MD, PhD 09/16/2020

## 2020-09-17 LAB — MULTIPLE MYELOMA PANEL, SERUM
Albumin SerPl Elph-Mcnc: 3.3 g/dL (ref 2.9–4.4)
Albumin/Glob SerPl: 1 (ref 0.7–1.7)
Alpha 1: 0.3 g/dL (ref 0.0–0.4)
Alpha2 Glob SerPl Elph-Mcnc: 0.8 g/dL (ref 0.4–1.0)
B-Globulin SerPl Elph-Mcnc: 1.1 g/dL (ref 0.7–1.3)
Gamma Glob SerPl Elph-Mcnc: 1.3 g/dL (ref 0.4–1.8)
Globulin, Total: 3.4 g/dL (ref 2.2–3.9)
IgA: 248 mg/dL (ref 61–437)
IgG (Immunoglobin G), Serum: 1332 mg/dL (ref 603–1613)
IgM (Immunoglobulin M), Srm: 86 mg/dL (ref 15–143)
Total Protein ELP: 6.7 g/dL (ref 6.0–8.5)

## 2020-09-17 LAB — AFP TUMOR MARKER: AFP, Serum, Tumor Marker: 4.4 ng/mL (ref 0.0–8.3)

## 2020-09-17 LAB — KAPPA/LAMBDA LIGHT CHAINS
Kappa free light chain: 36.8 mg/L — ABNORMAL HIGH (ref 3.3–19.4)
Kappa, lambda light chain ratio: 1.46 (ref 0.26–1.65)
Lambda free light chains: 25.2 mg/L (ref 5.7–26.3)

## 2020-09-19 ENCOUNTER — Telehealth: Payer: Self-pay

## 2020-09-19 NOTE — Telephone Encounter (Signed)
-----   Message from Earlie Server, MD sent at 09/18/2020 10:37 PM EDT ----- Let him know that blood work results are fine. Keep scheduled appt.

## 2020-09-19 NOTE — Telephone Encounter (Signed)
Patient notified and voiced understanding.

## 2021-01-15 ENCOUNTER — Inpatient Hospital Stay: Payer: Medicare HMO

## 2021-01-15 ENCOUNTER — Inpatient Hospital Stay: Payer: Medicare HMO | Admitting: Oncology

## 2021-08-15 IMAGING — CT CT BIOPSY AND ASPIRATION BONE MARROW
1 of 2 series · 11 of 14 positions shown, 14 images · non-contrast
Comparison: none

INDICATION: 79-year-old male with history of pancytopenia.

[Series 2: i-spiral 5.0 b30f · axial · 0.68mm/px · z∈[+1046,+1162]mm · 11 of 41 slices shown, 14 images]
[im 4/41  soft-tissue]
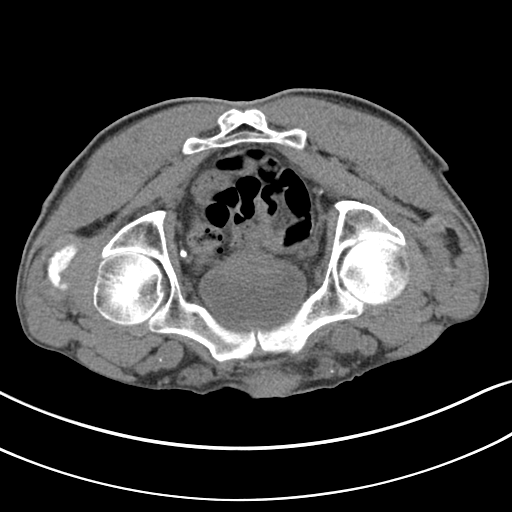
[im 4/41  bone]
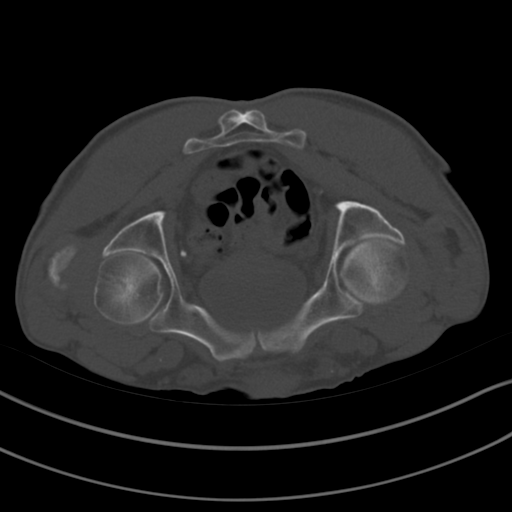
[im 7/41  bone]
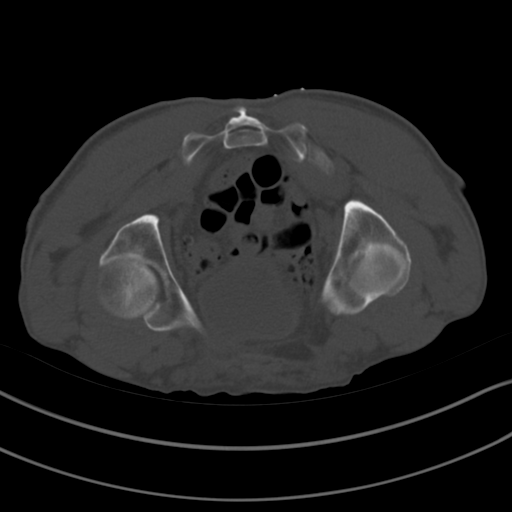
[im 11/41  bone]
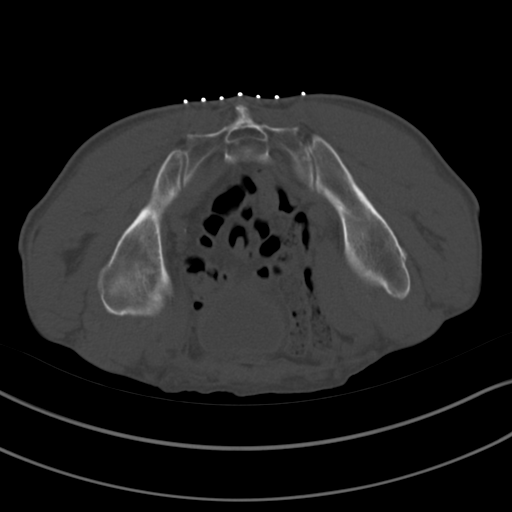
[im 14/41  bone]
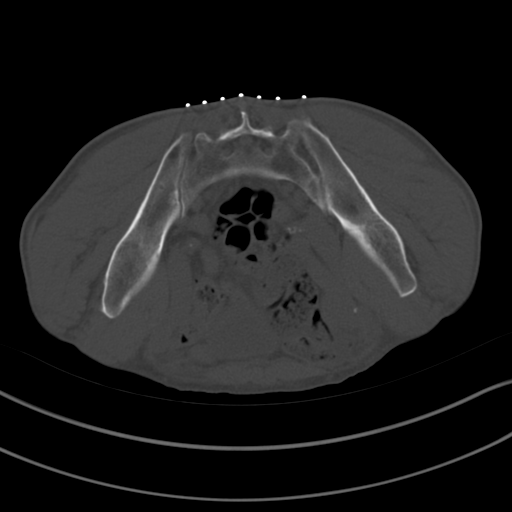
[im 17/41  soft-tissue]
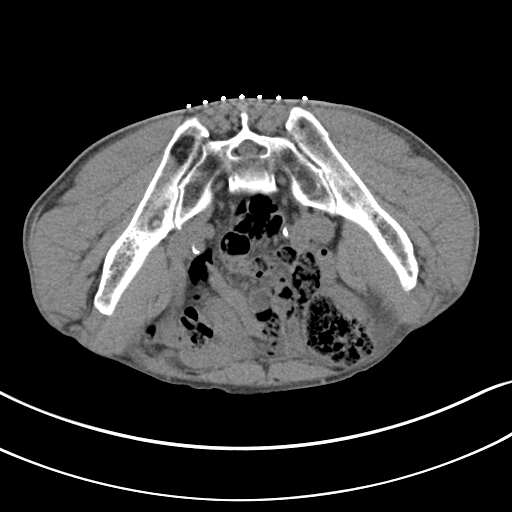
[im 17/41  bone]
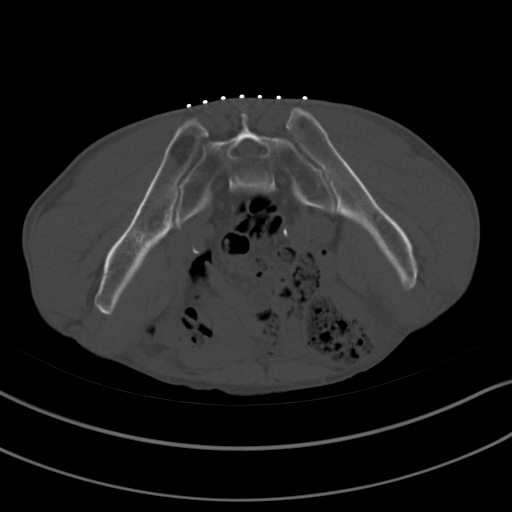
[im 21/41  bone]
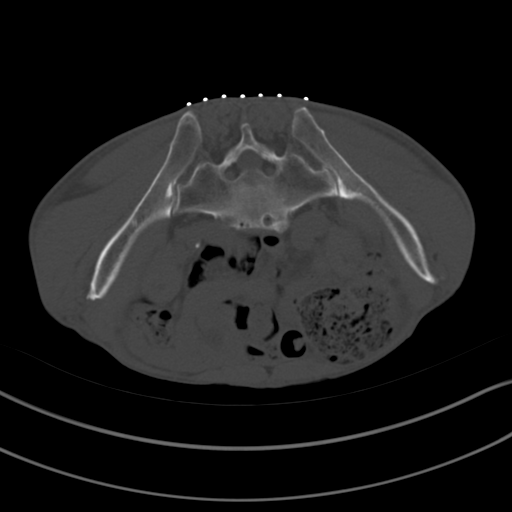
[im 24/41  bone]
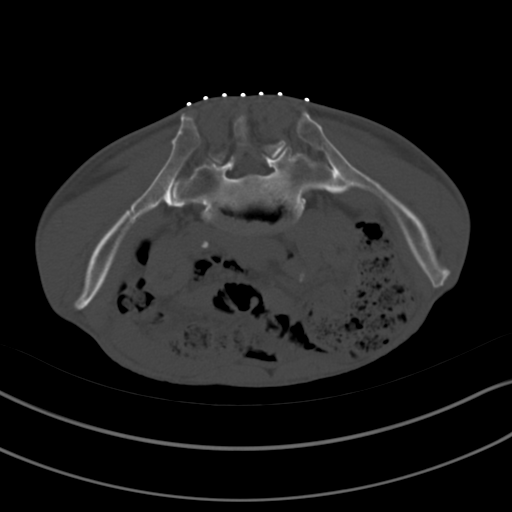
[im 27/41  bone]
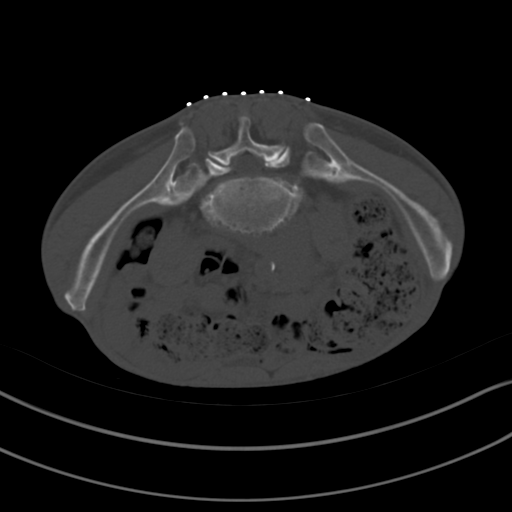
[im 31/41  soft-tissue]
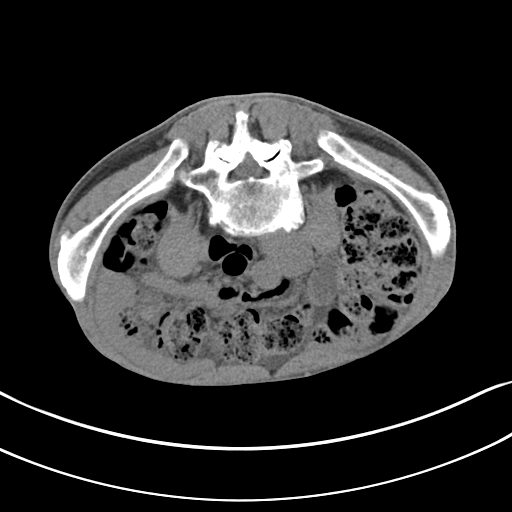
[im 31/41  bone]
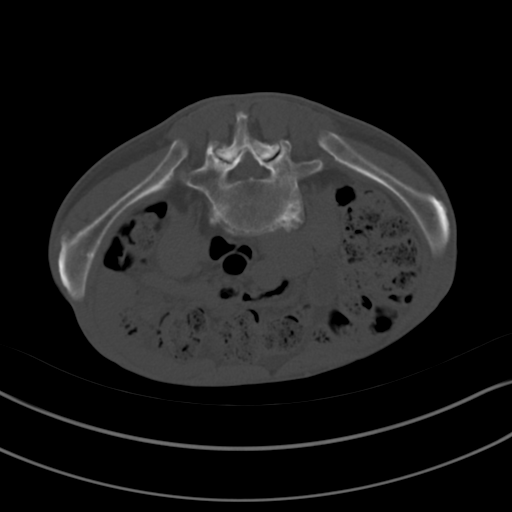
[im 34/41  bone]
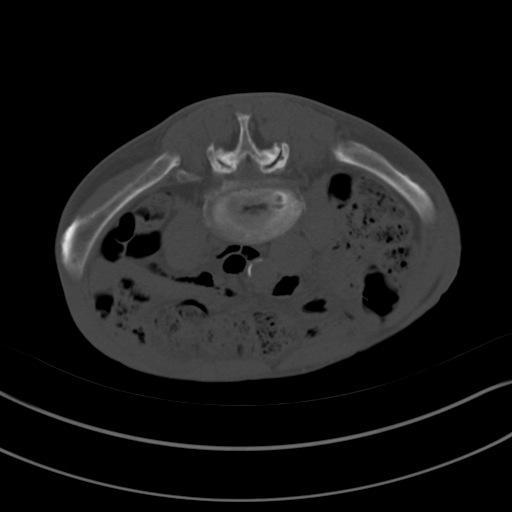
[im 37/41  bone]
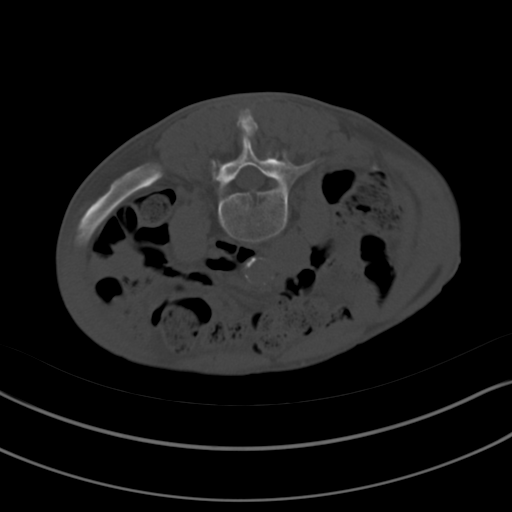

[11 of 14 positions shown; findings below may reference images not displayed]

EXAM:
CT-GUIDED BONE MARROW BIOPSY AND ASPIRATION

MEDICATIONS:
None

ANESTHESIA/SEDATION:
Fentanyl 25 mcg IV; Versed 0.5 mg IV

Sedation Time: 10 minutes; The patient was continuously monitored
during the procedure by the interventional radiology nurse under my
direct supervision.

COMPLICATIONS:
None immediate.

PROCEDURE:
Informed consent was obtained from the patient following an
explanation of the procedure, risks, benefits and alternatives. The
patient understands, agrees and consents for the procedure. All
questions were addressed. A time out was performed prior to the
initiation of the procedure.

The patient was positioned prone and non-contrast localization CT
was performed of the pelvis to demonstrate the iliac marrow spaces.
The operative site was prepped and draped in the usual sterile
fashion.

Under sterile conditions and local anesthesia, a 22 gauge spinal
needle was utilized for procedural planning. Next, an 11 gauge
coaxial bone biopsy needle was advanced into the right iliac marrow
space. Needle position was confirmed with CT imaging. Initially, a
bone marrow aspiration was performed. Next, a bone marrow biopsy was
obtained with the 11 gauge outer bone marrow device. The 11 gauge
coaxial bone biopsy needle was re-advanced into a slightly different
location within the left iliac marrow space, positioning was
confirmed with CT imaging and an additional bone marrow biopsy was
obtained. Samples were prepared with the cytotechnologist and deemed
adequate. The needle was removed and superficial hemostasis was
obtained with manual compression. A dressing was applied. The
patient tolerated the procedure well without immediate post
procedural complication.
IMPRESSION: Successful CT guided right iliac bone marrow aspiration and core
biopsy.

## 2022-11-16 DEATH — deceased
